# Patient Record
Sex: Male | Born: 1985
Health system: Southern US, Community
[De-identification: ages and names within clinical notes are randomized; demographics above are authoritative.]

## PROBLEM LIST (undated history)

## (undated) DIAGNOSIS — I10 Essential (primary) hypertension: Secondary | ICD-10-CM

## (undated) DIAGNOSIS — K649 Unspecified hemorrhoids: Secondary | ICD-10-CM

---

## 2017-05-15 ENCOUNTER — Emergency Department (HOSPITAL_COMMUNITY): Payer: Self-pay

## 2017-05-15 ENCOUNTER — Emergency Department (HOSPITAL_COMMUNITY)
Admission: EM | Admit: 2017-05-15 | Discharge: 2017-05-15 | Disposition: A | Discharge status: Home / Self Care | Payer: Self-pay | Attending: Emergency Medicine | Admitting: Emergency Medicine

## 2017-05-15 ENCOUNTER — Other Ambulatory Visit: Payer: Self-pay

## 2017-05-15 ENCOUNTER — Encounter (HOSPITAL_COMMUNITY): Payer: Self-pay | Admitting: Emergency Medicine

## 2017-05-15 DIAGNOSIS — R0789 Other chest pain: Secondary | ICD-10-CM | POA: Insufficient documentation

## 2017-05-15 DIAGNOSIS — R0602 Shortness of breath: Secondary | ICD-10-CM | POA: Insufficient documentation

## 2017-05-15 LAB — BASIC METABOLIC PANEL
Anion gap: 7 (ref 5–15)
BUN: 10 mg/dL (ref 6–20)
CALCIUM: 9.5 mg/dL (ref 8.9–10.3)
CHLORIDE: 103 mmol/L (ref 101–111)
CO2: 28 mmol/L (ref 22–32)
CREATININE: 0.98 mg/dL (ref 0.61–1.24)
GFR calc non Af Amer: >60 mL/min (ref >60)
GLUCOSE: 92 mg/dL (ref 65–99)
Potassium: 4.2 mmol/L (ref 3.5–5.1)
Sodium: 138 mmol/L (ref 135–145)

## 2017-05-15 LAB — CBC
HCT: 46.2 % (ref 39.0–52.0)
Hemoglobin: 15.4 g/dL (ref 13.0–17.0)
MCH: 27.7 pg (ref 26.0–34.0)
MCHC: 33.3 g/dL (ref 30.0–36.0)
MCV: 83.1 fL (ref 78.0–100.0)
PLATELETS: 255 10*3/uL (ref 150–400)
RBC: 5.56 MIL/uL (ref 4.22–5.81)
RDW: 14.2 % (ref 11.5–15.5)
WBC: 4.8 10*3/uL (ref 4.0–10.5)

## 2017-05-15 LAB — D-DIMER, QUANTITATIVE: D-Dimer, Quant: <0.27 ug/mL-FEU (ref 0.00–0.50)

## 2017-05-15 LAB — I-STAT TROPONIN, ED: TROPONIN I, POC: 0 ng/mL (ref 0.00–0.08)

## 2017-05-15 MED ORDER — ALBUTEROL SULFATE HFA 108 (90 BASE) MCG/ACT IN AERS
2.0000 | INHALATION_SPRAY | RESPIRATORY_TRACT | Status: DC | PRN
  Administered 2017-05-15: 2 via RESPIRATORY_TRACT
  Filled 2017-05-15: qty 6.7

## 2017-05-15 NOTE — ED Notes (Signed)
Sent label down to add on

## 2017-05-15 NOTE — ED Triage Notes (Signed)
Pt reports L CP radiating to back x 3 weeks, endorses SOB with exertion, denies leg swelling, hx CHF.  Pt also reports intermittent HA, was evaluated and dx with tension HA.  Resp e/u at this time, NAD noted.

## 2017-05-15 NOTE — ED Notes (Signed)
Walked pt to Pod C, pt O2 stayed at 100%.

## 2017-05-15 NOTE — ED Provider Notes (Signed)
[facility] Provider Note   CSN: [id number] Arrival date & time: 05/15/17  1100     History   Chief Complaint Chief Complaint  Patient presents with  . Chest Pain  . Headache    HPI Darrell Monroe is a 31 y.o. male.  Patient is a 31 year old male with no significant past medical history who presents with chest pain and shortness of breath.  He states that about 3 weeks ago he started having shortness of breath, particularly with exertion.  He also notes a sharp pain that starts in his left chest and radiates to his left back.  The pain is  worse with deep breaths.  He has a little bit of a cough in the morning but it is nonproductive.  No URI symptoms.  No fevers.  No leg pain or swelling.  In about a month and a half ago he did have a trip that was about 3 hours long.  He has no history of blood clots but he does have a family history of blood clots in his mom.  He denies tobacco use.  Denies any asthma history.      History reviewed. No pertinent past medical history.  There are no active problems to display for this patient.   History reviewed. No pertinent surgical history.     Home Medications    Prior to Admission medications   Not on File    Family History No family history on file.  Social History Social History   Tobacco Use  . Smoking status: Never Smoker  . Smokeless tobacco: Never Used  Substance Use Topics  . Alcohol use: No    Frequency: Never  . Drug use: No     Allergies   Patient has no allergy information on record.   Review of Systems Review of Systems  Constitutional: Negative for chills, diaphoresis, fatigue and fever.  HENT: Negative for congestion, rhinorrhea and sneezing.   Eyes: Negative.   Respiratory: Positive for cough and shortness of breath. Negative for chest tightness.   Cardiovascular: Positive for chest pain. Negative for leg swelling.  Gastrointestinal: Negative for abdominal  pain, blood in stool, diarrhea, nausea and vomiting.  Genitourinary: Negative for difficulty urinating, flank pain, frequency and hematuria.  Musculoskeletal: Negative for arthralgias and back pain.  Skin: Negative for rash.  Neurological: Negative for dizziness, speech difficulty, weakness, numbness and headaches.     Physical Exam Updated Vital Signs BP (!) 147/69   Pulse 70   Temp 98.4 F (36.9 C) (Oral)   Resp 16   Ht 5' 3.5" (1.613 m)   Wt 111.1 kg (245 lb)   SpO2 100%   BMI 42.72 kg/m   Physical Exam  Constitutional: He is oriented to person, place, and time. He appears well-developed and well-nourished.  HENT:  Head: Normocephalic and atraumatic.  Eyes: Pupils are equal, round, and reactive to light.  Neck: Normal range of motion. Neck supple.  Cardiovascular: Normal rate, regular rhythm and normal heart sounds.  Pulmonary/Chest: Effort normal and breath sounds normal. No respiratory distress. He has no wheezes. He has no rales. He exhibits no tenderness.  Abdominal: Soft. Bowel sounds are normal. There is no tenderness. There is no rebound and no guarding.  Musculoskeletal: Normal range of motion. He exhibits no edema.  No edema or calf tenderness  Lymphadenopathy:    He has no cervical adenopathy.  Neurological: He is alert and oriented to person, place, and time.  Skin:  Skin is warm and dry. No rash noted.  Psychiatric: He has a normal mood and affect.     ED Treatments / Results  Labs (all labs ordered are listed, but only abnormal results are displayed) Labs Reviewed  BASIC METABOLIC PANEL  CBC  D-DIMER, QUANTITATIVE (NOT AT [facility])  I-STAT TROPONIN, ED    EKG  EKG Interpretation  Date/Time:  Monday May 15 2017 11:17:43 EST Ventricular Rate:  72 PR Interval:  126 QRS Duration: 86 QT Interval:  392 QTC Calculation: 429 R Axis:   89 Text Interpretation:  Normal sinus rhythm Cannot rule out Anterior infarct , age undetermined Abnormal ECG No old  tracing to compare Confirmed by Rolan BuccoBelfi, Mackenzey Crownover [id number]) on 05/15/2017 4:52:03 PM       Radiology Dg Chest 2 View  Result Date: 05/15/2017 CLINICAL DATA:  Three weeks of chest pain and shortness of breath. Nonsmoker. EXAM: CHEST  2 VIEW COMPARISON:  None in PACs FINDINGS: The lungs are well-expanded with mild hemidiaphragm flattening. There is no infiltrate, atelectasis, or pleural effusion. There is no pneumothorax or pneumomediastinum. The heart and pulmonary vascularity are normal. The bony thorax exhibits no acute abnormality. IMPRESSION: Borderline hyperinflation may be voluntary or may reflect reactive airway disease. There is no other acute cardiopulmonary abnormality. Electronically Signed   By: David  Swazil[place]Jordan M.D.   On: 05/15/2017 12:14    Procedures Procedures (including critical care time)  Medications Ordered in ED Medications  albuterol (PROVENTIL HFA;VENTOLIN HFA) 108 (90 Base) MCG/ACT inhaler 2 puff (2 puffs Inhalation Given 05/15/17 1742)     Initial Impression / Assessment and Plan / ED Course  I have reviewed the triage vital signs and the nursing notes.  Pertinent labs & imaging results that were available during my care of the patient were reviewed by me and considered in my medical decision making (see chart for details).     Patient presents with some shortness of breath and left-sided pleuritic chest pain.  His chest x-ray is clear although there is some possible evidence of reactive airway disease.  There is no evidence of pneumonia or pneumothorax.  There is no suggestions of acute coronary syndrome.  He is currently denying any pain.  His d-dimer is negative and he has no other suggestions of pulmonary embolus.  No tachycardia or hypoxia.  He was able to ambulate around the ED without any symptoms or hypoxia.  Will give him a trial of an inhaler.  He was encouraged to follow-up with a PCP.  He was given a list of outpatient resources.  Return precautions were  given.  Final Clinical Impressions(s) / ED Diagnoses   Final diagnoses:  Shortness of breath  Atypical chest pain    ED Discharge Orders    None       Rolan BuccoBelfi, Adlene Adduci, MD 05/15/17 [phone]

## 2018-08-28 MED FILL — CITALOPRAM HBR 10 MG TABLET: 10 | 30 days supply | Qty: 30 | Fill #0

## 2018-11-05 MED FILL — CITALOPRAM HBR 10 MG TABLET: 10 | 30 days supply | Qty: 30 | Fill #1

## 2018-11-07 IMAGING — DX DG CHEST 2V
2 series · 2 of 2 positions shown · non-contrast
Comparison: None in PACs

CLINICAL DATA: Three weeks of chest pain and shortness of breath.
Nonsmoker.

EXAM:
CHEST  2 VIEW

[chest pa]
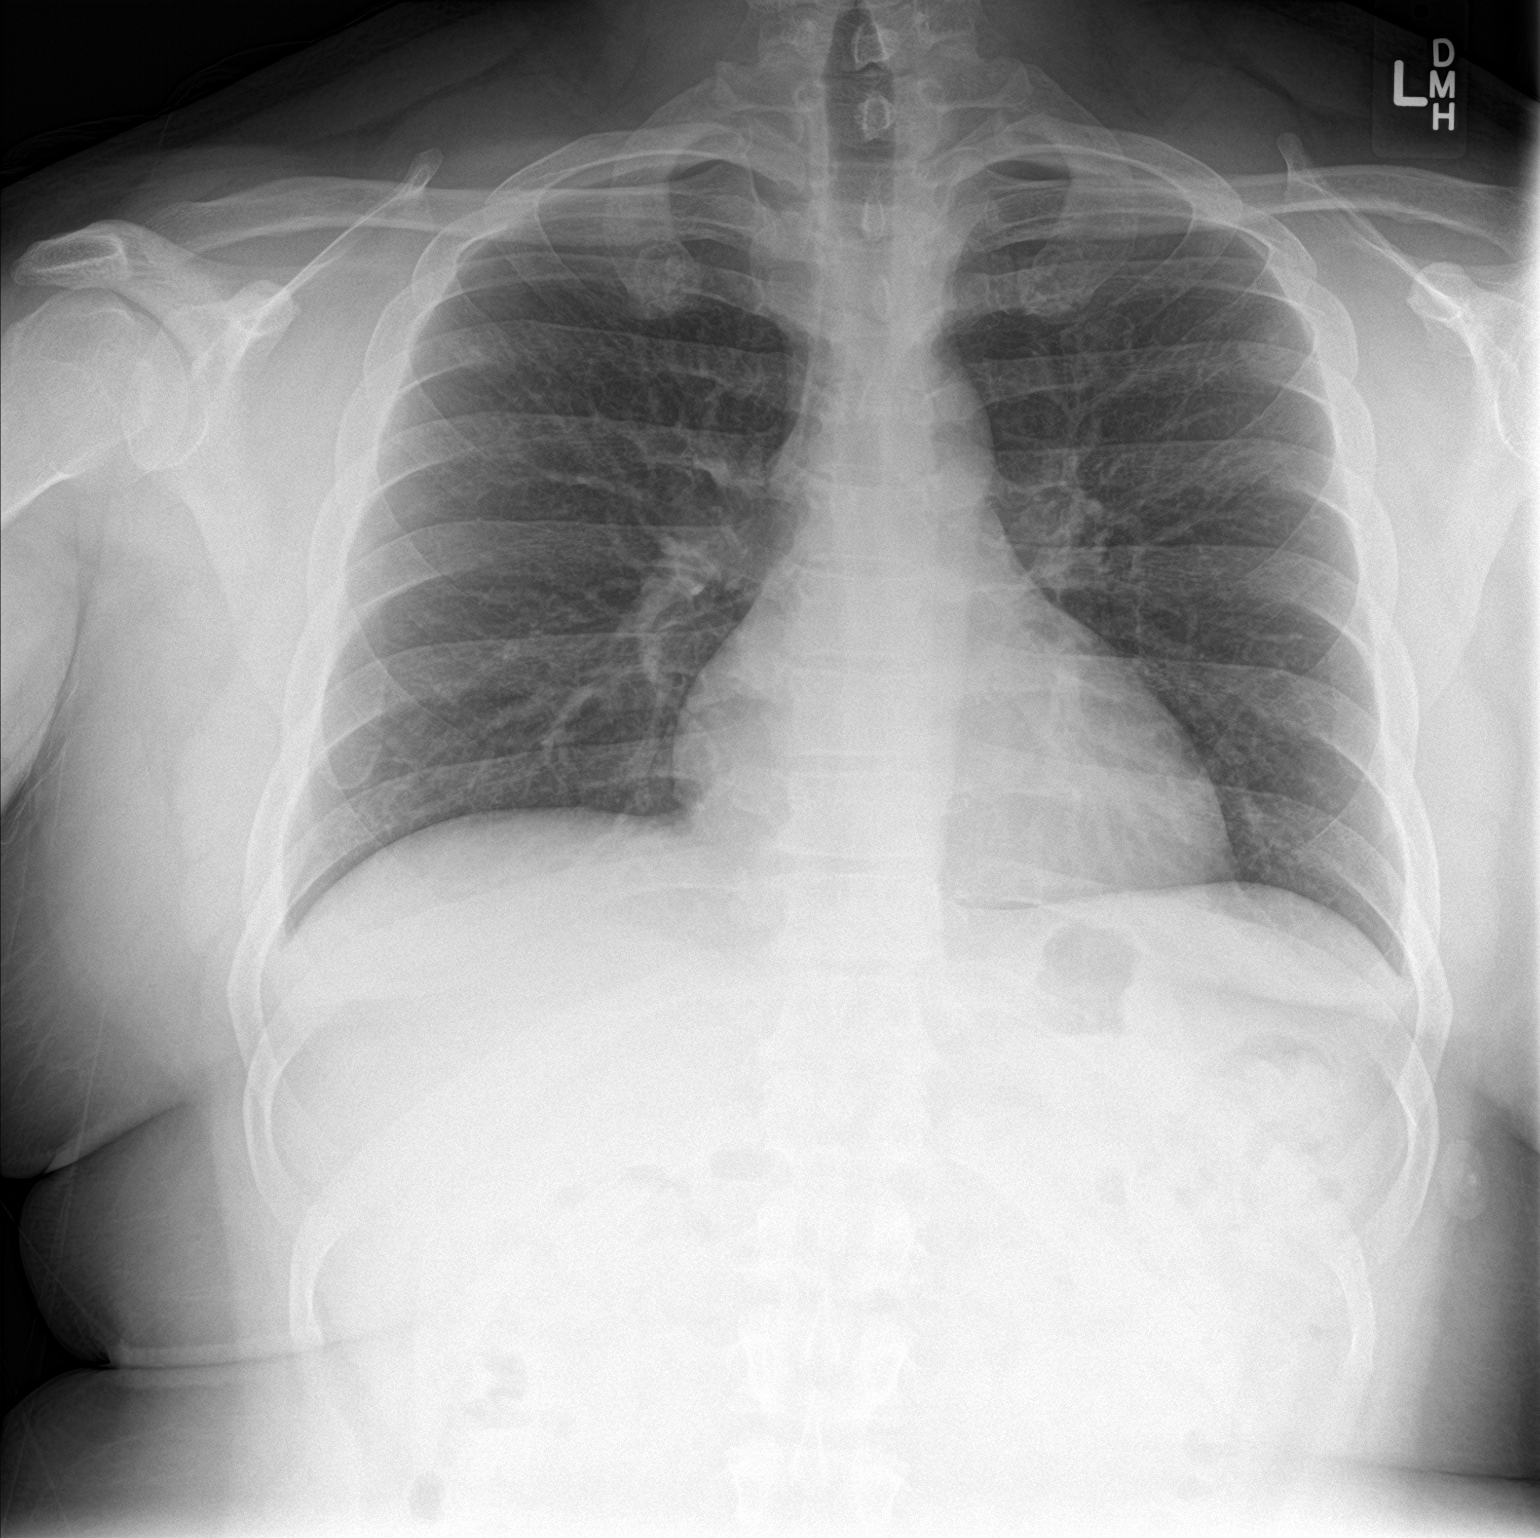

[chest lat]
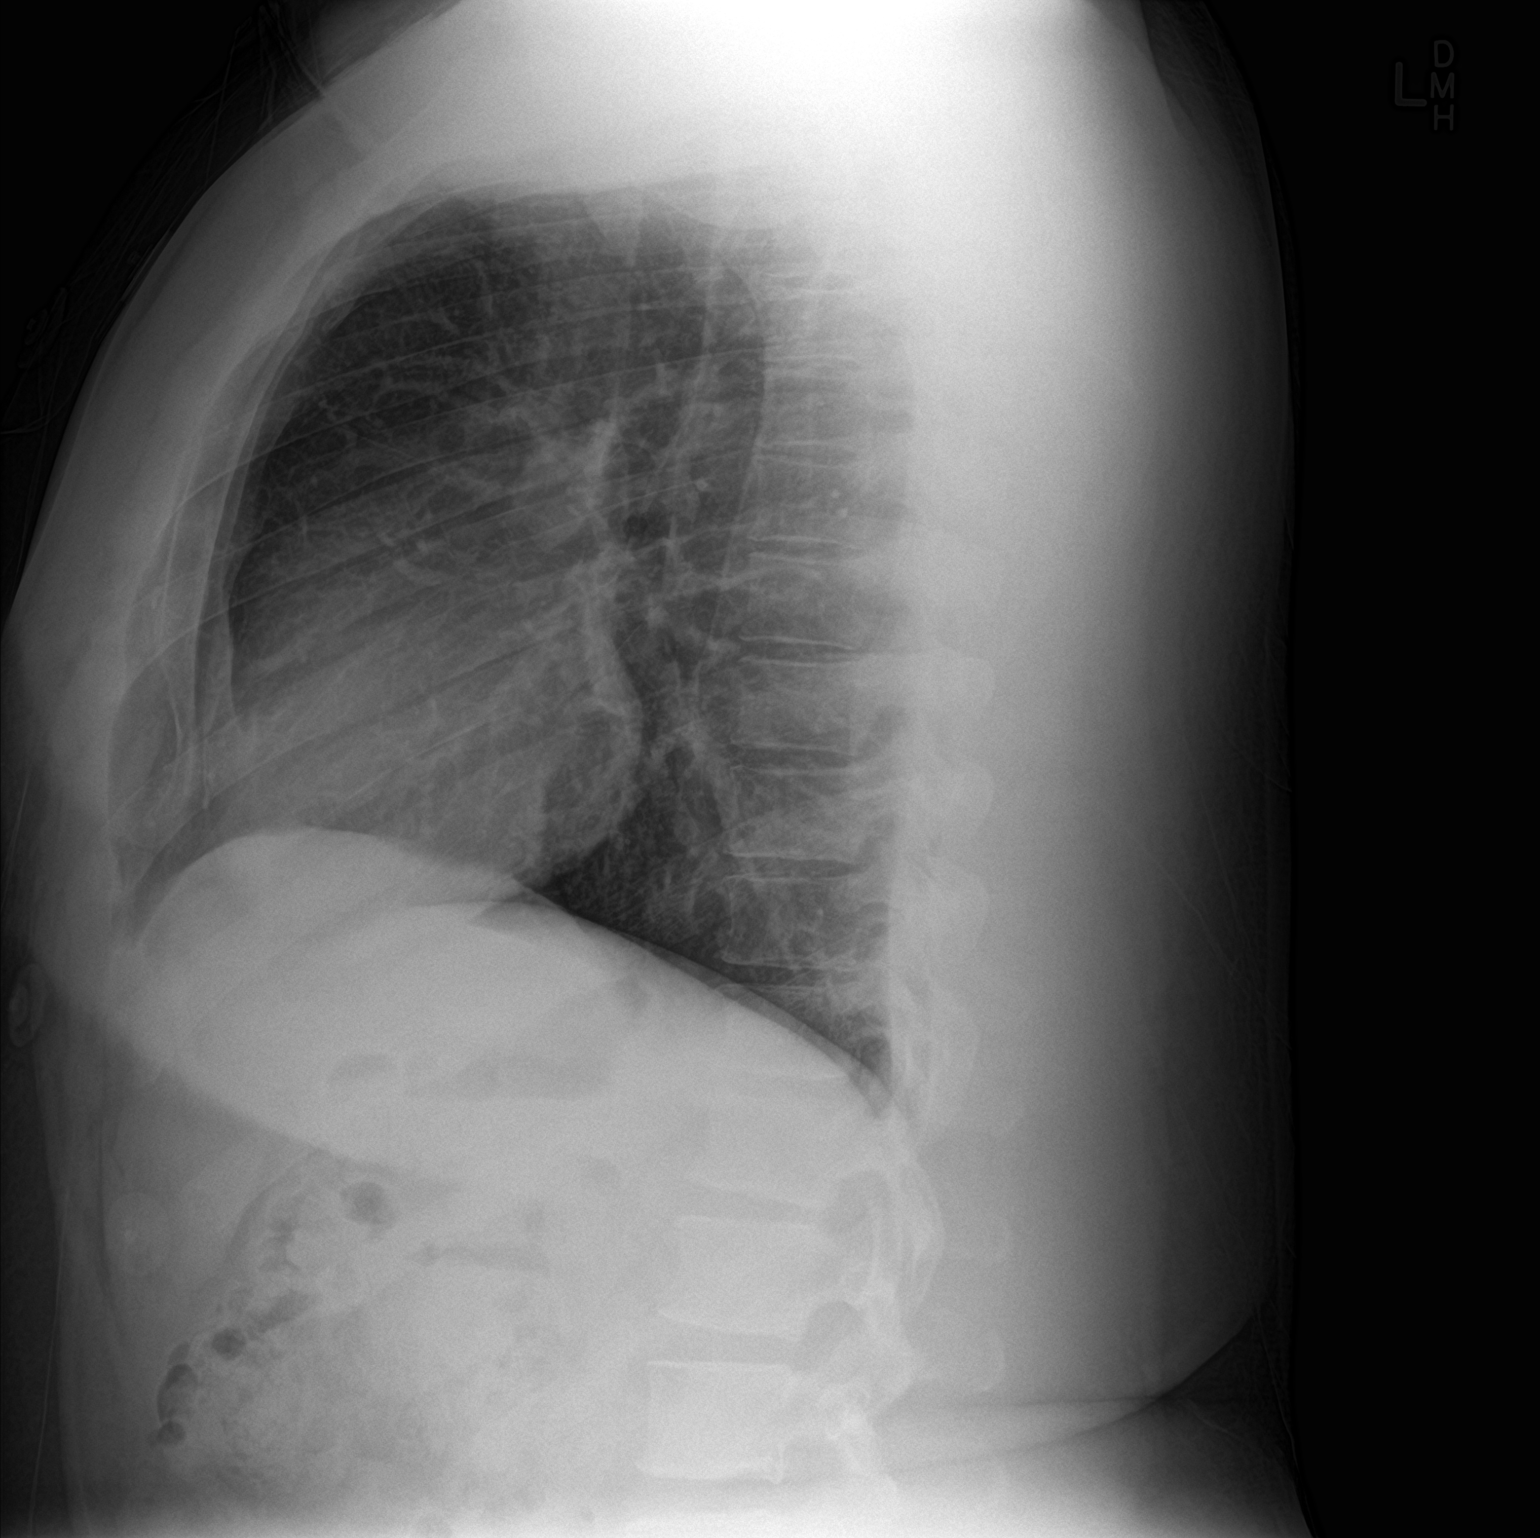

[2 of 2 positions shown; findings below may reference images not displayed]

FINDINGS: The lungs are well-expanded with mild hemidiaphragm flattening.
There is no infiltrate, atelectasis, or pleural effusion. There is
no pneumothorax or pneumomediastinum. The heart and pulmonary
vascularity are normal. The bony thorax exhibits no acute
abnormality.
IMPRESSION: Borderline hyperinflation may be voluntary or may reflect reactive
airway disease. There is no other acute cardiopulmonary abnormality.

## 2019-09-02 ENCOUNTER — Other Ambulatory Visit (HOSPITAL_COMMUNITY): Payer: Self-pay | Admitting: Internal Medicine

## 2019-11-08 ENCOUNTER — Other Ambulatory Visit (HOSPITAL_COMMUNITY): Payer: Self-pay | Admitting: Internal Medicine

## 2019-12-09 ENCOUNTER — Other Ambulatory Visit (HOSPITAL_COMMUNITY): Payer: Self-pay | Admitting: Internal Medicine

## 2020-06-18 ENCOUNTER — Other Ambulatory Visit (HOSPITAL_COMMUNITY): Payer: Self-pay | Admitting: Gastroenterology

## 2020-06-26 ENCOUNTER — Other Ambulatory Visit: Payer: Self-pay | Admitting: Gastroenterology

## 2020-08-11 NOTE — Progress Notes (Signed)
Attempted to obtain medical history via telephone, unable to reach at this time. I left a voicemail to return pre surgical testing department's phone call.  

## 2020-08-13 ENCOUNTER — Other Ambulatory Visit: Payer: Self-pay | Admitting: Gastroenterology

## 2020-08-14 ENCOUNTER — Other Ambulatory Visit (HOSPITAL_COMMUNITY): Payer: BC Managed Care – PPO

## 2020-08-17 ENCOUNTER — Encounter (HOSPITAL_COMMUNITY): Payer: Self-pay | Admitting: Certified Registered Nurse Anesthetist

## 2020-08-17 NOTE — Anesthesia Preprocedure Evaluation (Deleted)
Anesthesia Evaluation    Reviewed: Allergy & Precautions, Patient's Chart, lab work & pertinent test results  History of Anesthesia Complications Negative for: history of anesthetic complications  Airway        Dental   Pulmonary neg pulmonary ROS,           Cardiovascular negative cardio ROS       Neuro/Psych negative neurological ROS  negative psych ROS   GI/Hepatic Neg liver ROS, GERD  ,LLQ abd pain, rectal bleeding   Endo/Other  negative endocrine ROS  Renal/GU negative Renal ROS  negative genitourinary   Musculoskeletal negative musculoskeletal ROS (+)   Abdominal   Peds  Hematology negative hematology ROS (+)   Anesthesia Other Findings Day of surgery medications reviewed with patient.  Reproductive/Obstetrics negative OB ROS                             Anesthesia Physical Anesthesia Plan  ASA:   Anesthesia Plan: MAC   Post-op Pain Management:    Induction:   PONV Risk Score and Plan: Treatment may vary due to age or medical condition and Propofol infusion  Airway Management Planned: Natural Airway and Nasal Cannula  Additional Equipment:   Intra-op Plan:   Post-operative Plan:   Informed Consent:   Plan Discussed with:   Anesthesia Plan Comments:         Anesthesia Quick Evaluation

## 2020-08-18 ENCOUNTER — Encounter (HOSPITAL_COMMUNITY): Payer: Self-pay

## 2020-08-18 ENCOUNTER — Ambulatory Visit (HOSPITAL_COMMUNITY): Admit: 2020-08-18 | Payer: BC Managed Care – PPO | Admitting: Gastroenterology

## 2020-08-18 SURGERY — COLONOSCOPY WITH PROPOFOL
Anesthesia: Monitor Anesthesia Care

## 2020-10-08 ENCOUNTER — Other Ambulatory Visit (HOSPITAL_COMMUNITY): Payer: Self-pay

## 2020-10-08 MED FILL — Linaclotide Cap 290 MCG: ORAL | 30 days supply | Qty: 30 | Fill #0 | Status: CN

## 2020-10-08 MED FILL — Pantoprazole Sodium EC Tab 40 MG (Base Equiv): ORAL | 30 days supply | Qty: 30 | Fill #0 | Status: CN

## 2020-10-08 MED FILL — Citalopram Hydrobromide Tab 20 MG (Base Equiv): ORAL | 30 days supply | Qty: 30 | Fill #0 | Status: AC

## 2020-10-08 MED FILL — Ergocalciferol Cap 1.25 MG (50000 Unit): ORAL | 28 days supply | Qty: 4 | Fill #0 | Status: AC

## 2020-11-09 ENCOUNTER — Other Ambulatory Visit (HOSPITAL_COMMUNITY): Payer: Self-pay

## 2020-11-09 MED ORDER — CITALOPRAM HYDROBROMIDE 20 MG PO TABS
ORAL_TABLET | Freq: Every evening | ORAL | 2 refills | Status: DC
Start: 2020-11-09 — End: 2020-11-16
  Filled 2020-11-09: qty 30, 30d supply, fill #0

## 2020-11-09 MED FILL — Ergocalciferol Cap 1.25 MG (50000 Unit): ORAL | 28 days supply | Qty: 4 | Fill #1 | Status: AC

## 2020-11-10 ENCOUNTER — Other Ambulatory Visit (HOSPITAL_COMMUNITY): Payer: Self-pay

## 2020-11-16 ENCOUNTER — Other Ambulatory Visit: Payer: Self-pay

## 2020-11-16 ENCOUNTER — Encounter: Payer: Self-pay | Admitting: Family Medicine

## 2020-11-16 ENCOUNTER — Ambulatory Visit (INDEPENDENT_AMBULATORY_CARE_PROVIDER_SITE_OTHER): Payer: BC Managed Care – PPO | Admitting: Family Medicine

## 2020-11-16 ENCOUNTER — Other Ambulatory Visit (HOSPITAL_COMMUNITY): Payer: Self-pay

## 2020-11-16 ENCOUNTER — Other Ambulatory Visit: Payer: Self-pay | Admitting: Family Medicine

## 2020-11-16 VITALS — BP 124/86 | HR 67 | Ht 66.5 in | Wt 316.0 lb

## 2020-11-16 DIAGNOSIS — Z113 Encounter for screening for infections with a predominantly sexual mode of transmission: Secondary | ICD-10-CM | POA: Diagnosis not present

## 2020-11-16 DIAGNOSIS — Z7689 Persons encountering health services in other specified circumstances: Secondary | ICD-10-CM | POA: Diagnosis not present

## 2020-11-16 DIAGNOSIS — K625 Hemorrhage of anus and rectum: Secondary | ICD-10-CM

## 2020-11-16 DIAGNOSIS — F411 Generalized anxiety disorder: Secondary | ICD-10-CM

## 2020-11-16 DIAGNOSIS — F324 Major depressive disorder, single episode, in partial remission: Secondary | ICD-10-CM

## 2020-11-16 HISTORY — DX: Hemorrhage of anus and rectum: K62.5

## 2020-11-16 HISTORY — DX: Persons encountering health services in other specified circumstances: Z76.89

## 2020-11-16 LAB — POCT HEMOGLOBIN: Hemoglobin: 14 g/dL (ref 11–14.6)

## 2020-11-16 MED ORDER — CITALOPRAM HYDROBROMIDE 40 MG PO TABS
40.0000 mg | ORAL_TABLET | Freq: Every day | ORAL | 3 refills | Status: DC
Start: 2020-11-16 — End: 2020-12-10
  Filled 2020-11-16: qty 30, 30d supply, fill #0

## 2020-11-16 NOTE — Progress Notes (Signed)
SUBJECTIVE:   CHIEF COMPLAINT / HPI:   New Patient   Current concerns Always tired, 2-3 red bulls per day. Sleeps 11-7 nightly. Problems with depression but has not been treated. Feels down all the time. Tried to walk in front of a bus in the past. Has had periods of feeling full of energy where he cant sleep.   Blood in stool Every other week. Some times black and tarry sometimes bright red. Last happened on week ago.  Reports that he has spoken to his primary care provider before in the past about this but that he said it may be has some internal hemorrhoids.  Has not seen a GI doctor about this.  Of note, per chart review patient had a colonoscopy scheduled for 08/18/2020 which was canceled.  PMH  Depression  SI attempt 2019 stepped in front of a bus  Acid reflux- omeprazole daily  PSH  None   Allergies  Bees  Social  Live in [place] with brothers. 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 Does not drin, smoke, illicit substances, marijauna. Has depression. No SI or HI a this. Sexually active with women, uses protection. No hx of STIs  Family history Maternal asthma, bleeding disorder, depression, diabetes, hypertension Paternal alcohol and drug abuse A sister with asthma, depression, CVA Brother with asthma and depression Grandparents with asthma, bleeding/clotting disorder, heart attack, diabetes, hyperlipidemia, hypertension, kidney disease, osteoporosis, strokes  OBJECTIVE:   BP 124/86   Pulse 67   Ht 5' 6.5" (1.689 m)   Wt (!) 316 lb (143.3 kg)   SpO2 98%   BMI 50.24 kg/m   General: Obese, well-appearing 35 year old male in no acute distress Cardiac: Regular rate and rhythm, no murmurs appreciated Respiratory: Normal work of breathing, lungs clear to auscultation bilaterally Abdomen: Diffuse abdominal tenderness, positive bowel sounds, soft abdomen Rectal exam: No gross abnormalities, no appreciable external hemorrhoids MSK: No gross abnormalities Psych: Normal mood  although he does report that he gets down regularly, denies any SI or HI.  Reports suicide attempt in 2019 where he tried to step in front of a bus.  Is currently compliant with Celexa.  ASSESSMENT/PLAN:   Encounter to establish care with new doctor Patient here to establish care.  See concerns below.  Bright red blood per rectum Patient reports longstanding history of bright red blood per rectum which is occasionally dark and tarry for years.  He reports it happens approximately every other week.  Reports sometimes he sees large amounts of blood.  Denies any hemorrhoids that he knows of.  Per chart review he has been scheduled to see gastroenterology for colonoscopy but that procedure was canceled.  I am placing referral for him to see gastroenterology today.  I also had a POC hemoglobin performed today which came back within normal limits but we will check a CBC to verify.  Strict ED precautions given.  Depression, major, single episode, in partial remission [facility]) Patient with longtime history of depression.  He reports he is on Celexa 20 mg daily which she feels is helping some but he still has considerable sadness.  Does report intermittent issues with elevated activity concerning for possible mania given he has been on this medication for considerable amount of time I do not feel that this is likely.  Denies any SI or HI at this time although reports he has had a suicide attempt in 2019.  Provided patient with information regarding psychiatric resources and put a referral in for care coordination.  Strict ED precautions given.  Routine screening for STI (sexually transmitted infection) Patient is sexually active.  Reports he uses protection.  He has generalized fatigue so we will check for HIV.  We will also check for RPR, hepatitis C,     Darrell Nip, MD [facility]

## 2020-11-16 NOTE — Assessment & Plan Note (Signed)
Patient is sexually active.  Reports he uses protection.  He has generalized fatigue so we will check for HIV.  We will also check for RPR, hepatitis C,

## 2020-11-16 NOTE — Assessment & Plan Note (Signed)
Patient here to establish care.  See concerns below.

## 2020-11-16 NOTE — Assessment & Plan Note (Signed)
Patient with longtime history of depression.  He reports he is on Celexa 20 mg daily which she feels is helping some but he still has considerable sadness.  Does report intermittent issues with elevated activity concerning for possible mania given he has been on this medication for considerable amount of time I do not feel that this is likely.  Denies any SI or HI at this time although reports he has had a suicide attempt in 2019.  Provided patient with information regarding psychiatric resources and put a referral in for care coordination.  Strict ED precautions given.

## 2020-11-16 NOTE — Patient Instructions (Signed)
It was wonderful meeting you today.  Regarding your fatigue I am concerned that this is either related to anemia or your depression.  We are going to collect some lab work to look for reasons for this fatigue.  Regarding your bleeding from your rectum I have put a referral for gastroenterology.  They will be contacting you to schedule an appointment.  Regarding your depression I have increased your Celexa and I would like to see you in 2 weeks.  I also want you to establish with a psychiatrist, we have placed a referral for someone to help you with this but below is some information on psychiatrist in the area.  If you have any worsening symptoms, thoughts of harming yourself please seek medical attention immediately.  I hope you have a wonderful afternoon and I will see you in 2 weeks.  Psychiatry Resource List (Adults and Children) Most of these providers will take Medicaid. please consult your insurance for a complete and updated list of available providers. When calling to make an appointment have your insurance information available to confirm you are covered.   [facility]:Psychiatry and Counseling [address] [place], [place] [address] [phone]  [facility]  [address] [place], [place] [phone] Crisis [phone]   [facility]:   [facility]: [address]     [phone]   [facility]: [address] [place]. [place],        [phone] [place]: [address],    [phone] Kathryne Sharper: [address],                   [phone] Children: [facility] [address]         [phone]   [facility]  (Psychiatry only; Adults /children 12 and over, will take Medicaid)  [address] Laurell Josephs [address], [place], [place] [address]       [phone]   [facility] (Psychiatry & counseling ; adults & children ; will take  Medicaid [address]  [place] [address]   Go on-line to complete referral ( https://www.[facility].org/en/make-a-referral [phone]   (Spanish therapist)  [facility]  Psychiatry & counseling; Adults and children;  Call Registration prior to scheduling an appointment [phone] [address]    [place], [place] [address]    [phone]  [facility] (Psychiatry & counseling; adults & children; Medicare no Medicaid)  [address]   [place], [place]  [address]      [phone]    [facility] (up to age 57)  Psychiatry & counseling ,will take Medicaid, must do counseling to receive psychiatry services  [address] [place] [address]        [phone]  [facility] (Psychiatry & counseling; adults & children; will take Medicaid) Will need a referral from provider [address],  [place], [place]  [phone]   [facility] --- Walk-In Mon-Friday 8am-3pm ( will take Medicaid, Psychiatry, Adults & children,  [address] [place], [place]   [phone]   [facility] [place]--, Walk-in M-F 8am-12pm and 1pm -3pm   (Counseling, Psychiatry, will take Medicaid, adults & children)  [address], [place], [place]  [phone]     If you are feeling suicidal or depression symptoms worsen please immediately go  to:   24 Hour Availability [facility]  [address] [place], [place] [phone] Crisis [phone]    If you are thinking about harming yourself or having thoughts of suicide, or if you know someone who is, seek help right away. Call your doctor or mental health care provider. Call 911 or go to a hospital emergency room to get immediate help, or ask a friend or family member to help you do these things. Call the [place] National Suicide Prevention Lifeline's toll-free, 24-hour hotline at  1-800-273-TALK (346) 121-6098) or TTY: 1-800-799-4 TTY (720-258-2690) to talk to a trained counselor. If you are in crisis, make sure you are not left alone.  If someone else is in crisis, make sure he or she is not left alone   [facility] [employer] (Domestic Violence, Rape & Victim Assistance [phone]  [facility]    (ONLY from 8am-4pm)    [phone]  [facility] Mobile Crisis Unit (24/7)   [phone]  [place] National Suicide Hotline   [phone] Len Childs)

## 2020-11-16 NOTE — Assessment & Plan Note (Signed)
Patient reports longstanding history of bright red blood per rectum which is occasionally dark and tarry for years.  He reports it happens approximately every other week.  Reports sometimes he sees large amounts of blood.  Denies any hemorrhoids that he knows of.  Per chart review he has been scheduled to see gastroenterology for colonoscopy but that procedure was canceled.  I am placing referral for him to see gastroenterology today.  I also had a POC hemoglobin performed today which came back within normal limits but we will check a CBC to verify.  Strict ED precautions given.

## 2020-11-17 LAB — COMPREHENSIVE METABOLIC PANEL
ALT: 43 IU/L (ref 0–44)
AST: 25 IU/L (ref 0–40)
Albumin/Globulin Ratio: 1.5 (ref 1.2–2.2)
Albumin: 4.2 g/dL (ref 4.0–5.0)
Alkaline Phosphatase: 51 IU/L (ref 44–121)
BUN/Creatinine Ratio: 13 (ref 9–20)
BUN: 15 mg/dL (ref 6–20)
Bilirubin Total: <0.2 mg/dL (ref 0.0–1.2)
CO2: 27 mmol/L (ref 20–29)
Calcium: 9.7 mg/dL (ref 8.7–10.2)
Chloride: 100 mmol/L (ref 96–106)
Creatinine, Ser: 1.12 mg/dL (ref 0.76–1.27)
Globulin, Total: 2.8 g/dL (ref 1.5–4.5)
Glucose: 89 mg/dL (ref 65–99)
Potassium: 4.3 mmol/L (ref 3.5–5.2)
Sodium: 140 mmol/L (ref 134–144)
Total Protein: 7 g/dL (ref 6.0–8.5)
eGFR: 89 mL/min/{1.73_m2} (ref >59)

## 2020-11-17 LAB — RPR: RPR Ser Ql: NONREACTIVE

## 2020-11-17 LAB — CBC WITH DIFFERENTIAL/PLATELET
Basophils Absolute: 0 10*3/uL (ref 0.0–0.2)
Basos: 1 %
EOS (ABSOLUTE): 0.1 10*3/uL (ref 0.0–0.4)
Eos: 3 %
Hematocrit: 44.1 % (ref 37.5–51.0)
Hemoglobin: 14.4 g/dL (ref 13.0–17.7)
Immature Grans (Abs): 0 10*3/uL (ref 0.0–0.1)
Immature Granulocytes: 0 %
Lymphocytes Absolute: 2.3 10*3/uL (ref 0.7–3.1)
Lymphs: 43 %
MCH: 26.4 pg — ABNORMAL LOW (ref 26.6–33.0)
MCHC: 32.7 g/dL (ref 31.5–35.7)
MCV: 81 fL (ref 79–97)
Monocytes Absolute: 0.5 10*3/uL (ref 0.1–0.9)
Monocytes: 9 %
Neutrophils Absolute: 2.3 10*3/uL (ref 1.4–7.0)
Neutrophils: 44 %
Platelets: 313 10*3/uL (ref 150–450)
RBC: 5.46 x10E6/uL (ref 4.14–5.80)
RDW: 13.8 % (ref 11.6–15.4)
WBC: 5.2 10*3/uL (ref 3.4–10.8)

## 2020-11-17 LAB — HCV AB W REFLEX TO QUANT PCR: HCV Ab: 0.2 s/co ratio (ref 0.0–0.9)

## 2020-11-17 LAB — HIV ANTIBODY (ROUTINE TESTING W REFLEX): HIV Screen 4th Generation wRfx: NONREACTIVE

## 2020-11-17 LAB — HCV INTERPRETATION

## 2020-11-17 LAB — SEDIMENTATION RATE: Sed Rate: 12 mm/hr (ref 0–15)

## 2020-11-18 ENCOUNTER — Telehealth: Payer: Self-pay | Admitting: *Deleted

## 2020-11-18 NOTE — Chronic Care Management (AMB) (Signed)
  Care Management   Outreach Note  11/18/2020 Name: Darrell Monroe MRN: [id number] DOB: 05/17/1986  Referred by: Derrel Nip, MD Reason for referral : Care Coordination (Initial outreach to schedule referral with Licensed Clinical SW )   An unsuccessful telephone outreach was attempted today. The patient was referred to the case management team for assistance with care management and care coordination.   Follow Up Plan: A HIPAA compliant phone message was left for the patient providing contact information and requesting a return call.  If patient returns call to provider office, please advise to call Embedded Care Management Care Guide Darrell Monroe at [phone]  Burman Nieves, CCMA Care Guide, Embedded Care Coordination [facility]  Care Management  Direct Dial: [phone]

## 2020-11-26 NOTE — Chronic Care Management (AMB) (Signed)
  Care Management   Note  11/26/2020 Name: Darrell Monroe MRN: [id number] DOB: 1985/07/09  Darrell Monroe is a 35 y.o. year old male who is a primary care patient of Derrel Nip, MD. I reached out to Darrell Monroe by phone today in response to a referral sent by Mr. Jeromiah Ohalloran Koone's health plan.    Mr. Otten was given information about care management services today including:  Care management services include personalized support from designated clinical staff supervised by his physician, including individualized plan of care and coordination with other care providers 24/7 contact phone numbers for assistance for urgent and routine care needs. The patient may stop care management services at any time by phone call to the office staff.  Patient agreed to services and verbal consent obtained.   Follow up plan: Telephone appointment with care management team member scheduled for:12/01/2020  Burman Nieves, CCMA Care Guide, Embedded Care Coordination [facility]  Care Management  Direct Dial: [phone]

## 2020-11-27 ENCOUNTER — Other Ambulatory Visit (HOSPITAL_COMMUNITY): Payer: Self-pay

## 2020-12-01 ENCOUNTER — Ambulatory Visit: Payer: BC Managed Care – PPO | Admitting: Licensed Clinical Social Worker

## 2020-12-01 DIAGNOSIS — Z7689 Persons encountering health services in other specified circumstances: Secondary | ICD-10-CM

## 2020-12-01 NOTE — Chronic Care Management (AMB) (Signed)
    Clinical Social Work  Care Management   Phone Outreach    12/01/2020 Name: Darrell Monroe MRN: [id number] DOB: 12-Jul-1985  Darrell Monroe is a 35 y.o. year old male who is a primary care patient of Derrel Nip, MD .   1st call: CCM LCSW reached out to patient today by phone to introduce self, assess needs and offer Care Management services and interventions.    Telephone outreach was unsuccessful A HIPPA compliant phone message was left for the patient providing contact information and requesting a return call.   2nd call: CCM LCSW reached out to patient today by phone to introduce self, assess needs and offer Care Management services and interventions.    Unable to keep phone appointment today and requested to reschedule.  Plan:Appointment was rescheduled with CCM LCSW for Thursday December 10, 2020  Review of patient status, including review of consultants reports, relevant laboratory and other test results, and collaboration with appropriate care team members and the patient's provider was performed as part of comprehensive patient evaluation and provision of care management services.     Sammuel Hines, LCSW Care Management & Coordination  [facility]   [phone] 3:41 PM

## 2020-12-01 NOTE — Patient Instructions (Signed)
  Per your request your phone appointment with with me Has been rescheduled for Thursday July 14th    Thanks  Sammuel Hines, [facility] Care Management & Coordination  [phone]

## 2020-12-02 ENCOUNTER — Ambulatory Visit: Payer: BC Managed Care – PPO | Admitting: Family Medicine

## 2020-12-10 ENCOUNTER — Ambulatory Visit (INDEPENDENT_AMBULATORY_CARE_PROVIDER_SITE_OTHER): Payer: BC Managed Care – PPO | Admitting: Family Medicine

## 2020-12-10 ENCOUNTER — Ambulatory Visit: Payer: BC Managed Care – PPO | Admitting: Licensed Clinical Social Worker

## 2020-12-10 ENCOUNTER — Other Ambulatory Visit: Payer: Self-pay

## 2020-12-10 ENCOUNTER — Encounter: Payer: Self-pay | Admitting: Family Medicine

## 2020-12-10 VITALS — BP 122/68 | HR 72 | Ht 67.0 in | Wt 313.4 lb

## 2020-12-10 DIAGNOSIS — M5416 Radiculopathy, lumbar region: Secondary | ICD-10-CM

## 2020-12-10 DIAGNOSIS — M5442 Lumbago with sciatica, left side: Secondary | ICD-10-CM

## 2020-12-10 DIAGNOSIS — Z7189 Other specified counseling: Secondary | ICD-10-CM

## 2020-12-10 DIAGNOSIS — G8929 Other chronic pain: Secondary | ICD-10-CM | POA: Diagnosis not present

## 2020-12-10 MED ORDER — DULOXETINE HCL 30 MG PO CPEP: 30.0000 mg | ORAL_CAPSULE | Freq: Every day | ORAL | 3 refills | Status: DC

## 2020-12-10 NOTE — Progress Notes (Signed)
    SUBJECTIVE:   CHIEF COMPLAINT / HPI:   Back Pain Back pain for 1 year, takes naproxen for pain.  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  He reports that they are required to use proper mechanics when lifting but he is still having issues.  No loss of bowel or bladder function.  Depression and GI follow-up Patient reports that his depression is doing okay and he is supposed to meet with a psychiatrist.  He also has an appointment scheduled with a gastroenterologist scheduled for next week.  OBJECTIVE:   BP 122/68   Pulse 72   Ht 5\' 7"  (1.702 m)   Wt (!) 313 lb 6.4 oz (142.2 kg)   SpO2 97%   BMI 49.09 kg/m   General: Well-appearing 35 year old male, no acute distress Cardiac: Regular rate and rhythm, no murmurs appreciated Respiratory: Normal work of breathing, lungs clear to auscultation bilaterally Abdomen: Soft, nontender, positive bowel sounds MSK: Patient with full range of motion of the lumbar spine.  Pain noticed with extension as well as rotation.  Pain also noticed when leans to the right to try and pick something up.  Mild tenderness and paraspinal muscles to the right.  Reports that the pain shoots down his right lower extremity.  ASSESSMENT/PLAN:   Lumbar radiculopathy Patient with chronic low back pain complaining of pain shooting from his back down his right lower extremity.  Physical exam consistent with lumbar radiculopathy.  Pain has been going on for 1 year with worsening radiculopathy pain over the last month.  We will transition patient's antidepressant to Cymbalta to hopefully help with some neuropathic pain.  I have also placed referral for physical therapy.  We will follow-up in 2 months.  Discussed red flag symptoms and strict return precautions/ED precautions.  Patient is agreeable to this.  If pain does not improve may require imaging and referral to  neurosurgery.     20, MD [facility]

## 2020-12-10 NOTE — Chronic Care Management (AMB) (Signed)
Care Management Clinical Social Work Note  12/10/2020 Name: Darrell Monroe MRN: [id number] DOB: [birth year]  Darrell Monroe is a 36 y.o. year old male who is a primary care patient of Derrel Nip, MD.  The Care Management team was consulted for assistance with chronic disease management and coordination needs.  Engaged with patient by telephone for initial visit in response to provider referral for social work chronic care management and care coordination services  Consent to Services:  Darrell Monroe was given information about Care Management services today including:  Care Management services includes personalized support from designated clinical staff supervised by his physician, including individualized plan of care and coordination with other care providers 24/7 contact phone numbers for assistance for urgent and routine care needs. The patient may stop case management services at any time by phone call to the office staff.  Patient agreed to services and consent obtained.   Assessment: Patient is currently experiencing symptoms of  anxiety and depression which seems to be exacerbated by stress from work, reports he has never had counseling and would like to connect to mental health provider for medication evaluation and therapy. Offered to assist patient with connecting to providers he would like to make the calls himself.. See Care Plan below for interventions and patient self-care actives.  Recent life changes Efrain Sella: Stress at work  Recommendation: Patient may benefit from, and is in agreement to contact providers discussed during encounter today .  Follow up Plan: Patient would like continued follow-up from CCM LCSW .  Follow up scheduled in 2 weeks. Patient will call office if needed prior to next encounter.    Review of patient past medical history, allergies, medications, and health status, including review of relevant consultants reports was performed today as part of a  comprehensive evaluation and provision of chronic care management and care coordination services.  SDOH (Social Determinants of Health) assessments and interventions performed:    Advanced Directives Status: Not addressed in this encounter.  Care Plan  No Known Allergies  Outpatient Encounter Medications as of 12/10/2020  Medication Sig   citalopram (CELEXA) 40 MG tablet Take 1 tablet (40 mg total) by mouth daily.   linaclotide (LINZESS) 290 MCG CAPS capsule TAKE 1 CAPSULE BY MOUTH ONCE A DAY WITH FIRST MEAL 30 DAYS (Patient not taking: Reported on 11/16/2020)   pantoprazole (PROTONIX) 40 MG tablet TAKE 1 TABLET BY MOUTH ONCE A DAY 30 DAY(S) (Patient not taking: Reported on 11/16/2020)   Vitamin D, Ergocalciferol, (DRISDOL) 1.25 MG (50000 UNIT) CAPS capsule Take 50,000 Units by mouth every Thursday.   No facility-administered encounter medications on file as of 12/10/2020.    Patient Active Problem List   Diagnosis Date Noted   Encounter to establish care with new doctor 11/16/2020   Generalized anxiety disorder 11/16/2020   Bright red blood per rectum 11/16/2020   Depression, major, single episode, in partial remission (HCC) 11/16/2020   Routine screening for STI (sexually transmitted infection) 11/16/2020    Conditions to be addressed/monitored: Anxiety and Depression; Mental Health Concerns   Care Plan : General Social Work (Adult)  Updates made by Soundra Pilon, LCSW since 12/10/2020 12:00 AM   Problem: Emotional Distress    Goal: Emotional Health Supported by connecting with Mental Health Provider   Start Date: 12/10/2020  This Visit's Progress: On track  Priority: High  Current Barriers:  Care Coordination needs related to Mental Health Concerns  in a patient with Anxiety and Depression  Social Work  CM Clinical Goal(s):  Patient will work with Licensed Visual merchandiser to connect with Mental health provider to address needs related to Anxiety and Depression   Interventions: 1:1 collaboration with @PCP @ regarding development and update of comprehensive plan of care as evidenced by provider attestation and co-signature Inter-disciplinary care team collaboration (see longitudinal plan of care) Assessed patient's previous and current treatment, coping skills, support system and barriers to care  Review various resources, discussed options and provided patient information about  mental health providers based on patient's insurance [facility]) Mindfulness or [facility], Reviewed mental health medications with patient and discussed compliance: Patient is currently taking 40mg  of Celexa;, Participation in counseling encouraged , Provided EMMI education information on breathing to relax, and Discussed referral to [facility] to assist with connecting to mental health provider ; Patient would like to call providers on list and does not want [facility] referral at this time. Patient Goals/Self-Care Activities: Call [facility] [phone] to schedule your medication management appointment Call to schedule your counseling appointment from the list provided Continue with compliance of taking medication  Review your EMMI educational information on breathing to relax, Look for an e-mail from [facility]  Follow Up Plan: Telephone follow up appointment with care management team member scheduled for:12/24/2020       [phone], LCSW Care Management & Coordination  [facility] / [facility]   [phone] 2:29 PM

## 2020-12-10 NOTE — Patient Instructions (Signed)
It was great seeing you today.  I am sorry you are having these issues with your back pain.  You have what is called a lumbar radiculopathy.  I want to switch your antidepressant medication to a medication that helps with depression but also with nerve pain called Cymbalta.  I have also placed a referral for physical therapy and someone will be calling you to schedule an appointment.  I would like to follow-up with you on this and approximately 2 months.  Regarding your GI issues please be sure to follow-up with the gastroenterology doctors.  Regarding your depression please let me know if your depressive symptoms worsen, if you have any thoughts of self-harm please reach out.  Below is information on contacts for this.  I hope these treatments help with your back pain and her depression.  I hope you have a wonderful afternoon and if you have any questions or concerns please feel free to call the clinic.  If you are feeling suicidal or depression symptoms worsen please immediately go to:   If you are thinking about harming yourself or having thoughts of suicide, or if you know someone who is, seek help right away. If you are in crisis, make sure you are not left alone.  If someone else is in crisis, make sure he/she/they is not left alone  Call 988 OR 1-800-273-TALK  24 Hour Availability for Walk-IN services  [facility]  [address] [place], [place] [phone] Crisis [phone]    Other crisis resources:  [facility] [employer] (Domestic Violence, Rape & Victim Assistance [phone]  [facility]    (ONLY from 8am-4pm)    [phone]  [facility] Mobile Crisis Unit (24/7)   [phone]  [place] National Suicide Hotline   [phone] Len Childs)

## 2020-12-10 NOTE — Patient Instructions (Signed)
Licensed Clinical Social Worker Visit Information  Goals we discussed today:   Goals Addressed             This Visit's Progress    Begin and Stick with Counseling-Depression       Timeframe:  Short-Term Goal Priority:  High Start Date:  12/10/2020                           Expected End Date:                       Follow Up Date 12/24/20  Patient Goals/Self-Care Activities: Call [facility] [phone] to schedule your medication management appointment Call to schedule your counseling appointment from the list provided Continue with compliance of taking medication  Review your EMMI educational information on breathing to relax, Look for an e-mail from [facility]  Why is this important?   Beating depression may take some time.  If you don't feel better right away, don't give up on your treatment plan.       Mr. Darley was given information about Care Management services today including:  Care Management services include personalized support from designated clinical staff supervised by his physician, including individualized plan of care and coordination with other care providers 24/7 contact phone numbers for assistance for urgent and routine care needs. The patient may stop Care Management services at any time by phone call to the office staff.   Patient agreed to services and verbal consent obtained.  Patient verbalizes understanding of instructions provided today.   Follow up plan: Appointment scheduled for SW follow up with client by phone on: 12/24/2020   Sammuel Hines, LCSW Care Management & Coordination  [phone]

## 2020-12-11 DIAGNOSIS — M5416 Radiculopathy, lumbar region: Secondary | ICD-10-CM | POA: Insufficient documentation

## 2020-12-11 NOTE — Assessment & Plan Note (Signed)
Patient with chronic low back pain complaining of pain shooting from his back down his right lower extremity.  Physical exam consistent with lumbar radiculopathy.  Pain has been going on for 1 year with worsening radiculopathy pain over the last month.  We will transition patient's antidepressant to Cymbalta to hopefully help with some neuropathic pain.  I have also placed referral for physical therapy.  We will follow-up in 2 months.  Discussed red flag symptoms and strict return precautions/ED precautions.  Patient is agreeable to this.  If pain does not improve may require imaging and referral to neurosurgery.

## 2020-12-24 ENCOUNTER — Ambulatory Visit: Payer: BC Managed Care – PPO | Admitting: Licensed Clinical Social Worker

## 2020-12-24 DIAGNOSIS — Z789 Other specified health status: Secondary | ICD-10-CM

## 2020-12-24 NOTE — Chronic Care Management (AMB) (Signed)
Care Management   Clinical Social Work Note  12/24/2020 Name: Darrell Monroe MRN: [id number] DOB: 11-16-1985  Darrell Monroe is a 35 y.o. year old male who is a primary care patient of Derrel Nip, MD. The CCM team was consulted to assist the patient with chronic disease management and/or care coordination needs related to: Mental Health Counseling and Resources.   Engaged with patient by telephone for follow up visit in response to provider referral for social work chronic care management and care coordination services.   Consent to Services:  The patient was given information about Chronic Care Management services, agreed to services, and gave verbal consent prior to initiation of services.  Please see initial visit note for detailed documentation.   Patient agreed to services and consent obtained.   Assessment:  Patient continues to experience difficulty with connecting with mental health providers.Contiues to decline assistance from LCSW. States he will make the call. See Care Plan below for interventions and patient self-care actives.  Follow up Plan: Patient would like continued follow-up from CCM LCSW .  Follow up scheduled in one week per patient's request. Patient will call office if needed prior to next encounter.   Review of patient past medical history, allergies, medications, and health status, including review of relevant consultants reports was performed today as part of a comprehensive evaluation and provision of chronic care management and care coordination services.     SDOH (Social Determinants of Health) assessments and interventions performed:    Advanced Directives Status: Not addressed in this encounter.  CCM Care Plan  No Known Allergies  Outpatient Encounter Medications as of 12/24/2020  Medication Sig   DULoxetine (CYMBALTA) 30 MG capsule Take 1 capsule (30 mg total) by mouth daily.   linaclotide (LINZESS) 290 MCG CAPS capsule TAKE 1 CAPSULE BY MOUTH ONCE  A DAY WITH FIRST MEAL 30 DAYS (Patient not taking: Reported on 11/16/2020)   pantoprazole (PROTONIX) 40 MG tablet TAKE 1 TABLET BY MOUTH ONCE A DAY 30 DAY(S) (Patient not taking: Reported on 11/16/2020)   Vitamin D, Ergocalciferol, (DRISDOL) 1.25 MG (50000 UNIT) CAPS capsule Take 50,000 Units by mouth every Thursday.   No facility-administered encounter medications on file as of 12/24/2020.    Patient Active Problem List   Diagnosis Date Noted   Lumbar radiculopathy 12/11/2020   Encounter to establish care with new doctor 11/16/2020   Generalized anxiety disorder 11/16/2020   Bright red blood per rectum 11/16/2020   Depression, major, single episode, in partial remission (HCC) 11/16/2020   Routine screening for STI (sexually transmitted infection) 11/16/2020    Conditions to be addressed/monitored:  Mental Health Concerns   Care Plan : General Social Work (Adult)  Updates made by Soundra Pilon, LCSW since 12/24/2020 12:00 AM   Problem: Emotional Distress    Goal: Emotional Health Supported by connecting with Mental Health Provider   Start Date: 12/10/2020  This Visit's Progress: Not on track  Recent Progress: On track  Priority: High  Current Barriers:  Working and has not been able to call to schedule appointment Declines assistance from LCSW  Care Coordination needs related to Mental Health Concerns  in a patient with Anxiety and Depression Social Work CM Clinical Goal(s):  Patient will work with Licensed Visual merchandiser to connect with Mental health provider to address needs related to Anxiety and Depression  Interventions: Inter-disciplinary care team collaboration (see longitudinal plan of care) Assessed patient's progress and barriers to care  Review various resources, discussed  options and provided patient information about  mental health providers based on patient's insurance [facility]) Mindfulness or Relaxation Training, Reviewed mental health medications with  patient and discussed compliance: Patient is currently taking 40mg  of Celexa;, Participation in counseling encouraged , Provided EMMI education information on breathing to relax, and Discussed referral to [facility] to assist with connecting to mental health provider ; Patient would like to call providers on list and does not want [facility] referral at this time. Patient Goals/Self-Care Activities: Call [facility] [phone] to schedule your medication management appointment Call to schedule your counseling appointment from the list provided Continue with compliance of taking medication  Review your EMMI educational information on breathing to relax, Look for an e-mail from [facility]      [phone], Sammuel Hines Care Management & Coordination  [facility] / [facility]   [phone] 1:33 PM

## 2020-12-24 NOTE — Patient Instructions (Signed)
Visit Information   Goals Addressed             This Visit's Progress    Begin and Stick with Counseling-Depression   Not on track    Timeframe:  Short-Term Goal Priority:  High Start Date:  12/10/2020                           Expected End Date:                       Patient Goals/Self-Care Activities: Call [facility] [phone] to schedule your medication management appointment Call to schedule your counseling appointment from the list provided Continue with compliance of taking medication  Review your EMMI educational information on breathing to relax, Look for an e-mail from [facility]  Why is this important?   Beating depression may take some time.  If you don't feel better right away, don't give up on your treatment plan.       Patient verbalizes understanding of instructions provided today    Telephone follow up appointment with care management team member scheduled for:12/31/2020  Sammuel Hines, LCSW Care Management & Coordination  [phone]

## 2020-12-31 ENCOUNTER — Ambulatory Visit: Payer: BC Managed Care – PPO | Admitting: Licensed Clinical Social Worker

## 2020-12-31 DIAGNOSIS — Z7189 Other specified counseling: Secondary | ICD-10-CM

## 2020-12-31 NOTE — Patient Instructions (Signed)
Visit Information   Goals Addressed             This Visit's Progress    Begin and Stick with Counseling-Depression   Not on track    Timeframe:  Short-Term Goal Priority:  High Start Date:  12/10/2020                           Expected End Date:                       Patient Goals/Self-Care Activities: Call [facility] [phone] to schedule your medication management appointment Call to schedule your counseling appointment from the list provided Continue with compliance of taking medication  Review your EMMI educational information on breathing to relax, Look for an e-mail from [facility]  Why is this important?   Beating depression may take some time.  If you don't feel better right away, don't give up on your treatment plan.           Patient verbalizes understanding of instructions provided today.  No further follow up required: by LCSW  Sammuel Hines, LCSW Care Management & Coordination  [phone]

## 2020-12-31 NOTE — Chronic Care Management (AMB) (Signed)
Care Management   Clinical Social Work Note  12/31/2020 Name: Darrell Monroe MRN: [id number] DOB: [birth year]  Darrell Monroe is a 35 y.o. year old male who is a primary care patient of Derrel Nip, MD. The CCM team was consulted to assist the patient with chronic disease management and/or care coordination needs related to: Mental Health Counseling and Resources.   Engaged with patient by telephone for follow up visit in response to provider referral for social work chronic care management and care coordination services.   Consent to Services:  The patient was given information about Chronic Care Management services, agreed to services, and gave verbal consent prior to initiation of services.  Please see initial visit note for detailed documentation.   Patient agreed to services and consent obtained.   Assessment: Patient is not making progress with connecting with mental health provider. He continues to decline assistance with connecting to provider.  See Care Plan below for interventions and patient self-care actives.  Recent life changes /stressors: continue to work lots of hours  Follow up Plan:  Patient does not require or desire continued follow-up. Will contact the office if needed,  Patient may benefit from CCM LCSW remaining part of care team for the next 60 days in the event patient decides he would like assistance.  If no needs are identified in the next 60 days, CCM LCSW will disconnect from the care team.   Review of patient past medical history, allergies, medications, and health status, including review of relevant consultants reports was performed today as part of a comprehensive evaluation and provision of chronic care management and care coordination services.     SDOH (Social Determinants of Health) assessments and interventions performed:    Advanced Directives Status: Not addressed in this encounter.  CCM Care Plan  No Known Allergies  Outpatient Encounter  Medications as of 12/31/2020  Medication Sig   DULoxetine (CYMBALTA) 30 MG capsule Take 1 capsule (30 mg total) by mouth daily.   linaclotide (LINZESS) 290 MCG CAPS capsule TAKE 1 CAPSULE BY MOUTH ONCE A DAY WITH FIRST MEAL 30 DAYS (Patient not taking: Reported on 11/16/2020)   pantoprazole (PROTONIX) 40 MG tablet TAKE 1 TABLET BY MOUTH ONCE A DAY 30 DAY(S) (Patient not taking: Reported on 11/16/2020)   Vitamin D, Ergocalciferol, (DRISDOL) 1.25 MG (50000 UNIT) CAPS capsule Take 50,000 Units by mouth every Thursday.   No facility-administered encounter medications on file as of 12/31/2020.    Patient Active Problem List   Diagnosis Date Noted   Lumbar radiculopathy 12/11/2020   Encounter to establish care with new doctor 11/16/2020   Generalized anxiety disorder 11/16/2020   Bright red blood per rectum 11/16/2020   Depression, major, single episode, in partial remission (HCC) 11/16/2020   Routine screening for STI (sexually transmitted infection) 11/16/2020    Conditions to be addressed/monitored: Depression and Bipolar Disorder;   Care Plan : General Social Work (Adult)  Updates made by Soundra Pilon, LCSW since 12/31/2020 12:00 AM     Problem: Emotional Distress      Goal: Emotional Health Supported by connecting with Mental Health Provider   Start Date: 12/10/2020  This Visit's Progress: Not on track  Recent Progress: Not on track  Priority: High  Note:   Current Barriers:  Working and has not been able to call to schedule appointment Declines assistance from LCSW  Care Coordination needs related to Mental Health Concerns  in a patient with Anxiety and Depression Social Work CM  Clinical Goal(s):  Patient will work with Licensed Clinical Social Worker to connect with Mental health provider to address needs related to Anxiety and Depression  Interventions: Inter-disciplinary care team collaboration (see longitudinal plan of care) Assessed patient's progress and barriers to care   Has contact information for [facility] for medication management  Mindfulness or Relaxation Training, Reviewed mental health medications with patient and discussed compliance: Patient is currently taking 40mg  of Celexa;, Participation in counseling encouraged , Provided EMMI education information on breathing to relax, and Discussed referral to [facility] to assist with connecting to mental health provider ; Patient would like to call providers on list and does not want [facility] referral at this time. Patient Goals/Self-Care Activities: Call [facility] [phone] to schedule your medication management appointment Call to schedule your counseling appointment from the list provided Continue with compliance of taking medication  Review your EMMI educational information on breathing to relax, Look for an e-mail from [facility]     [phone], Sammuel Hines Care Management & Coordination  [facility] / [facility]   [phone] 3:41 PM

## 2021-01-19 ENCOUNTER — Other Ambulatory Visit: Payer: Self-pay

## 2021-01-19 ENCOUNTER — Ambulatory Visit: Payer: BC Managed Care – PPO | Admitting: Family Medicine

## 2021-01-19 DIAGNOSIS — R29898 Other symptoms and signs involving the musculoskeletal system: Secondary | ICD-10-CM

## 2021-01-19 NOTE — Progress Notes (Signed)
SUBJECTIVE:   CHIEF COMPLAINT / HPI:   Back pain and right leg pain - back pain with sitting down - first happened a couple of years ago - used to take vitamin D 50,000 units weekly, this helped a lot - has been out of the vitamin D for about two months - back pain aggravating factors: sitting down for too long - lifting weights is fine, heavy stuff no big deal  - right leg pain: shooting pain posterior leg from hip to knee happens when he stands up and walks - right leg relieving factors: walking for long time, if he slows down it hurts  - no known trauma, injury - no numbness or tingling in legs with this back pain or leg pain - some missing steps because of the right leg, feels weak - right leg has given out on him before; happened during work after standing up after a period of sitting about 1-2 months ago - no imaging of the back ever - no urinary or BM incontinence  - some saddle anesthesia when he sits down; happens when sitting down gets  better when he stands up - no family history osteopenia/osteoporosis, bone cancer, no tumors  - uncle died of prostate cancer  PHQ-9 positive for suicidal ideation - Patient reports he has been dealing with depression and anxiety for couple years now - Takes medicine for this - He indicates that he simply answered honestly, he has intermittent passive suicidal ideation - He is not too concerned by this, says this is his baseline - No active plan - Feels safe at home  PERTINENT  PMH / PSH: Lumbar radiculopathy, depression and anxiety  OBJECTIVE:   BP 122/78   Pulse 88   Wt (!) 310 lb 6.4 oz (140.8 kg)   SpO2 98%   BMI 48.62 kg/m    PHQ-9:  Depression screen [facility] 2/9 01/19/2021 12/10/2020 11/16/2020  Decreased Interest 1 1 1   Down, Depressed, Hopeless 2 2 2   PHQ - 2 Score 3 3 3   Altered sleeping 0 3 3  Tired, decreased energy 2 2 3   Change in appetite 2 3 3   Feeling bad or failure about yourself  2 2 3   Trouble  concentrating 3 2 2   Moving slowly or fidgety/restless 2 2 3   Suicidal thoughts 1 0 -  PHQ-9 Score 15 17 20   Difficult doing work/chores Somewhat difficult Somewhat difficult -     GAD-7: No flowsheet data found.   Physical Exam General: Awake, alert, oriented, no acute distress MSK: Slow to rise from chair, normal gait, exquisite tenderness to palpation over lumbosacral spinal processes, some TTP over right paraspinal musculature Extremities: No bilateral lower extremity edema, palpable pedal and pretibial pulses bilaterally Neuro: Cranial nerves II through X grossly intact, weakened right hip flexion (4/5 on right, 5/5 on left), weakened right knee flexion/extension (4/5 on right, 5/5 on left), equal ankle extension/flexion (5/5)  ASSESSMENT/PLAN:   Right proximal leg weakness On physical exam. Strength 4/5 on right of hip and knee.  Patient with history of right leg giving out upon standing.  Also associated with saddle anesthesia after periods of sitting down, often improved with standing or walking but not always.  Given midline lumbosacral TTP, weakened right hip/knee, periods of saddle anesthesia needs MRI quickly.  Unable to do this quickly on an outpatient basis because of prior auths and insurance. Sent to ED.  Patient agreeable, verbalized understanding.  Instructed to go to either [facility]  or [facility] as those EDs have access to MRI.     Fayette Pho, MD [facility]

## 2021-01-19 NOTE — Patient Instructions (Addendum)
It was wonderful to meet you today. Thank you for allowing me to be a part of your care. Below is a short summary of what we discussed at your visit today:  Low back pain with right leg weakness and saddle anesthesia - The symptoms you are telling me are concerning enough that I want you to go to the ED for an MRI - There is too much red tape with prior authorizations as an outpatient doctor to get you an MRI of your back quickly - I believe going to the ED is the best way to get imaging in a decent timeframe and consultation if you need it - I have also sent an anti-inflammatory to your pharmacy called Mobic.  This is a once a day anti-inflammatory pill.    If you have any questions or concerns, please do not hesitate to contact us via phone or MyChart message.   Fayette Pho, MD

## 2021-01-19 NOTE — Assessment & Plan Note (Signed)
On physical exam. Strength 4/5 on right of hip and knee.  Patient with history of right leg giving out upon standing.  Also associated with saddle anesthesia after periods of sitting down, often improved with standing or walking but not always.  Given midline lumbosacral TTP, weakened right hip/knee, periods of saddle anesthesia needs MRI quickly.  Unable to do this quickly on an outpatient basis because of prior auths and insurance. Sent to ED.  Patient agreeable, verbalized understanding.  Instructed to go to either [facility] or [facility] as those EDs have access to MRI.

## 2021-01-28 ENCOUNTER — Other Ambulatory Visit (HOSPITAL_COMMUNITY): Payer: Self-pay

## 2021-01-28 ENCOUNTER — Other Ambulatory Visit: Payer: Self-pay

## 2021-01-29 ENCOUNTER — Other Ambulatory Visit: Payer: Self-pay | Admitting: Family Medicine

## 2021-01-29 ENCOUNTER — Other Ambulatory Visit (HOSPITAL_COMMUNITY): Payer: Self-pay

## 2021-01-29 MED ORDER — VITAMIN D (ERGOCALCIFEROL) 1.25 MG (50000 UNIT) PO CAPS
50000.0000 [IU] | ORAL_CAPSULE | ORAL | 5 refills | Status: AC
  Filled 2021-01-29 – 2021-03-04 (×2): qty 4, 28d supply, fill #0
  Filled 2021-03-26: qty 4, 28d supply, fill #1

## 2021-02-08 ENCOUNTER — Other Ambulatory Visit (HOSPITAL_COMMUNITY): Payer: Self-pay

## 2021-03-04 ENCOUNTER — Other Ambulatory Visit (HOSPITAL_COMMUNITY): Payer: Self-pay

## 2021-03-16 ENCOUNTER — Encounter (HOSPITAL_COMMUNITY): Payer: Self-pay

## 2021-03-16 ENCOUNTER — Other Ambulatory Visit: Payer: Self-pay

## 2021-03-16 ENCOUNTER — Emergency Department (HOSPITAL_COMMUNITY)
Admission: EM | Admit: 2021-03-16 | Discharge: 2021-03-16 | Disposition: A | Discharge status: Home / Self Care | Payer: BC Managed Care – PPO | Attending: Emergency Medicine | Admitting: Emergency Medicine

## 2021-03-16 DIAGNOSIS — M5459 Other low back pain: Secondary | ICD-10-CM | POA: Diagnosis not present

## 2021-03-16 DIAGNOSIS — K921 Melena: Secondary | ICD-10-CM | POA: Insufficient documentation

## 2021-03-16 DIAGNOSIS — K625 Hemorrhage of anus and rectum: Secondary | ICD-10-CM | POA: Diagnosis not present

## 2021-03-16 DIAGNOSIS — K922 Gastrointestinal hemorrhage, unspecified: Secondary | ICD-10-CM | POA: Diagnosis not present

## 2021-03-16 DIAGNOSIS — M545 Low back pain, unspecified: Secondary | ICD-10-CM | POA: Diagnosis not present

## 2021-03-16 HISTORY — DX: Unspecified hemorrhoids: K64.9

## 2021-03-16 LAB — CBC
HCT: 48.4 % (ref 39.0–52.0)
Hemoglobin: 15.2 g/dL (ref 13.0–17.0)
MCH: 26 pg (ref 26.0–34.0)
MCHC: 31.4 g/dL (ref 30.0–36.0)
MCV: 82.7 fL (ref 80.0–100.0)
Platelets: 312 10*3/uL (ref 150–400)
RBC: 5.85 MIL/uL — ABNORMAL HIGH (ref 4.22–5.81)
RDW: 14.9 % (ref 11.5–15.5)
WBC: 5.1 10*3/uL (ref 4.0–10.5)
nRBC: 0 % (ref 0.0–0.2)

## 2021-03-16 LAB — TYPE AND SCREEN
ABO/RH(D): B POS
Antibody Screen: NEGATIVE

## 2021-03-16 LAB — POC OCCULT BLOOD, ED: Fecal Occult Bld: POSITIVE — AB

## 2021-03-16 LAB — COMPREHENSIVE METABOLIC PANEL
ALT: 50 U/L — ABNORMAL HIGH (ref 0–44)
AST: 34 U/L (ref 15–41)
Albumin: 4.1 g/dL (ref 3.5–5.0)
Alkaline Phosphatase: 53 U/L (ref 38–126)
Anion gap: 7 (ref 5–15)
BUN: 19 mg/dL (ref 6–20)
CO2: 28 mmol/L (ref 22–32)
Calcium: 8.7 mg/dL — ABNORMAL LOW (ref 8.9–10.3)
Chloride: 102 mmol/L (ref 98–111)
Creatinine, Ser: 1.06 mg/dL (ref 0.61–1.24)
GFR, Estimated: >60 mL/min (ref >60)
Glucose, Bld: 123 mg/dL — ABNORMAL HIGH (ref 70–99)
Potassium: 4.1 mmol/L (ref 3.5–5.1)
Sodium: 137 mmol/L (ref 135–145)
Total Bilirubin: 0.3 mg/dL (ref 0.3–1.2)
Total Protein: 8 g/dL (ref 6.5–8.1)

## 2021-03-16 MED ORDER — METHOCARBAMOL 500 MG PO TABS: 500.0000 mg | ORAL_TABLET | Freq: Two times a day (BID) | ORAL | 0 refills | Status: AC

## 2021-03-16 NOTE — ED Triage Notes (Signed)
Patient c/o left lower back pain x 1 week. Patient denies any injury or heavy lifting. Patient denies pain radiating down his left leg, but states "ironically I am having pain in the right leg."  Patient added that he had bright red blood with small clots in his stool yesterday and then again today.

## 2021-03-16 NOTE — Discharge Instructions (Addendum)
Avoid NSAID medications (ibuprofen, naprosyn, aspirin) until the cause of your bleeding is determined. You can try over the counter tylenol/Acetaminophen in addition to the muscle relaxer to help with your back pain.

## 2021-03-16 NOTE — ED Provider Notes (Signed)
[facility] EMERGENCY DEPARTMENT Provider Note  CSN: [id number] Arrival date & time: 03/16/21 4854    History Chief Complaint  Patient presents with   Back Pain   Blood In Stools    Darrell Monroe is a 35 y.o. male presents for evaluation of low back pain and BRBPR. He reports several days of aching L lowe back pain, worse with movement. Not radiating into his leg, not associated with numbness or tingling. Not improved with naprosyn.   He has also noted some blood mixed with his stool the last few days. No hard stool. No melena. He denies any blood thinners. Has had similar before told it wasn't hemorrhoids but has never followed with GI or had a colonoscopy despite being referred in the past.    Past Medical History:  Diagnosis Date   Bright red blood per rectum 11/16/2020   Encounter to establish care with new doctor 11/16/2020   Hemorrhoid     History reviewed. No pertinent surgical history.  Family History  Problem Relation Age of Onset   Hypertension Mother    Diabetes Mother     Social History   Tobacco Use   Smoking status: Never   Smokeless tobacco: Never  Vaping Use   Vaping Use: Never used  Substance Use Topics   Alcohol use: No   Drug use: No     Home Medications Prior to Admission medications   Medication Sig Start Date End Date Taking? Authorizing Provider  methocarbamol (ROBAXIN) 500 MG tablet Take 1 tablet (500 mg total) by mouth 2 (two) times daily. 03/16/21  Yes Pollyann Savoy, MD  DULoxetine (CYMBALTA) 30 MG capsule Take 1 capsule (30 mg total) by mouth daily. 12/10/20   Derrel Nip, MD  pantoprazole (PROTONIX) 40 MG tablet TAKE 1 TABLET BY MOUTH ONCE A DAY 30 DAY(S) Patient not taking: Reported on 11/16/2020 06/18/20 06/18/21  Charlott Rakes, MD  Vitamin D, Ergocalciferol, (DRISDOL) 1.25 MG (50000 UNIT) CAPS capsule Take 50,000 Units by mouth every Thursday. 07/21/20   [provider]  Vitamin D, Ergocalciferol, (DRISDOL)  1.25 MG (50000 UNIT) CAPS capsule Take 1 capsule (50,000 Units total) by mouth once a week. 01/29/21   Derrel Nip, MD     Allergies    Other   Review of Systems   Review of Systems A comprehensive review of systems was completed and negative except as noted in HPI.    Physical Exam BP (!) 170/97   Pulse 83   Temp 98.6 F (37 C) (Oral)   Resp 16   Ht 5\' 5"  (1.651 m)   Wt (!) 143 kg   SpO2 99%   BMI 52.45 kg/m   Physical Exam Vitals and nursing note reviewed.  Constitutional:      Appearance: Normal appearance.  HENT:     Head: Normocephalic and atraumatic.     Nose: Nose normal.     Mouth/Throat:     Mouth: Mucous membranes are moist.  Eyes:     Extraocular Movements: Extraocular movements intact.     Conjunctiva/sclera: Conjunctivae normal.  Cardiovascular:     Rate and Rhythm: Normal rate.  Pulmonary:     Effort: Pulmonary effort is normal.     Breath sounds: Normal breath sounds.  Abdominal:     General: Abdomen is flat.     Palpations: Abdomen is soft.     Tenderness: There is no abdominal tenderness.  Genitourinary:    Comments: No external hemorrhoids or fissures, no  mass or tenderness on digital exam. Soft brown stool Musculoskeletal:        General: Tenderness (L lumbar paraspinal muscles) present. No swelling. Normal range of motion.     Cervical back: Neck supple.  Skin:    General: Skin is warm and dry.  Neurological:     General: No focal deficit present.     Mental Status: He is alert.  Psychiatric:        Mood and Affect: Mood normal.     ED Results / Procedures / Treatments   Labs (all labs ordered are listed, but only abnormal results are displayed) Labs Reviewed  COMPREHENSIVE METABOLIC PANEL - Abnormal; Notable for the following components:      Result Value   Glucose, Bld 123 (*)    Calcium 8.7 (*)    ALT 50 (*)    All other components within normal limits  CBC - Abnormal; Notable for the following components:   RBC 5.85  (*)    All other components within normal limits  POC OCCULT BLOOD, ED - Abnormal; Notable for the following components:   Fecal Occult Bld POSITIVE (*)    All other components within normal limits  TYPE AND SCREEN    EKG None  Radiology No results found.  Procedures Procedures  Medications Ordered in the ED Medications - No data to display   MDM Rules/Calculators/A&P MDM Patient with MSK low back pain. Will treat with APAP and muscle relaxers. Avoid NSAIDs due to GI bleeding. Check labs and hemoccult.   ED Course  I have reviewed the triage vital signs and the nursing notes.  Pertinent labs & imaging results that were available during my care of the patient were reviewed by me and considered in my medical decision making (see chart for details).  Clinical Course as of 03/16/21 1052  Tue Mar 16, 2021  1024 CBC with normal Hgb, hemoccult is positive.  [CS]  1030 CMP is unremarkable.  [CS]  1045 Patient with BRBPR but no on blood thinners, no anemia and hemodynamically stable. Will refer to GI to complete his evaluation. Rx for Robaxin for his back pain. APAP OTC.  [CS]    Clinical Course User Index [CS] Pollyann Savoy, MD    Final Clinical Impression(s) / ED Diagnoses Final diagnoses:  Blood in stool  Acute left-sided low back pain without sciatica    Rx / DC Orders ED Discharge Orders          Ordered    methocarbamol (ROBAXIN) 500 MG tablet  2 times daily        03/16/21 1051             Pollyann Savoy, MD 03/16/21 1052

## 2021-03-17 ENCOUNTER — Ambulatory Visit: Payer: BC Managed Care – PPO | Admitting: Student

## 2021-03-17 ENCOUNTER — Other Ambulatory Visit: Payer: Self-pay

## 2021-03-17 ENCOUNTER — Ambulatory Visit (INDEPENDENT_AMBULATORY_CARE_PROVIDER_SITE_OTHER): Payer: BC Managed Care – PPO

## 2021-03-17 VITALS — BP 122/80 | HR 63 | Ht 65.0 in | Wt 313.0 lb

## 2021-03-17 DIAGNOSIS — Z23 Encounter for immunization: Secondary | ICD-10-CM

## 2021-03-17 DIAGNOSIS — M5416 Radiculopathy, lumbar region: Secondary | ICD-10-CM

## 2021-03-17 DIAGNOSIS — K625 Hemorrhage of anus and rectum: Secondary | ICD-10-CM | POA: Diagnosis not present

## 2021-03-17 DIAGNOSIS — R030 Elevated blood-pressure reading, without diagnosis of hypertension: Secondary | ICD-10-CM

## 2021-03-17 MED ORDER — FAMOTIDINE 40 MG PO TABS: 40.0000 mg | ORAL_TABLET | Freq: Every day | ORAL | 1 refills | Status: DC

## 2021-03-17 NOTE — Patient Instructions (Signed)
It was great to see you! Thank you for allowing me to participate in your care!   I recommend that you always bring your medications to each appointment as this makes it easy to ensure we are on the correct medications and helps [place] not miss when refills are needed.  Our plans for today:  - I reordered your MRI of the back, please get this done. Call [facility] to schedule MRI at [phone] - You may take tylenol every 6-8 hours (no more than 4 grams a day). Please hold off on taking naproxen until your GI appointment - We will follow up in 2 weeks for your blood pressure, I have added a DASH eating plan for you, weight loss can also help with lowering blood pressure - [facility] number is [phone] please reach out to schedule an appointment -take famotidine 40 mg a day for 8 weeks   Please get help if:   You have increased bleeding One or both of your legs or feet feel weak. One or both of your legs or feet lose feeling (have numbness). You have trouble controlling when you poop (have a bowel movement) or pee (urinate). You have bad back pain and: You feel like you may vomit (nauseous), or you vomit. You have pain in your belly (abdomen). You have shortness of breath. You faint.   Take care and seek immediate care sooner if you develop any concerns. Please remember to show up 15 minutes before your scheduled appointment time!  Levin Erp, MD [facility]

## 2021-03-17 NOTE — Progress Notes (Addendum)
SUBJECTIVE:   CHIEF COMPLAINT / HPI:   ED Follow Up For BP, Bloody Stools and Back Pain  Bloody Stools Presented to the hospital yesterday 10/18 for back pain and bloody stools. No anemia at the time. Was given APAP and muscle relaxer in ED and has not had colonoscopy before or seen GI although referred in June by [facility]. Hasn't had BM today, last one was last night around 8 pm with blood dark clumps and light red. Been going for months and stopped. Uses naproxen 1000 mg a day - otc, since June for his back pain. Had much more blood in stool yesterday and that was the reason he went to the ED.   Blood pressure BP in ED yesterday was 170/97. Has been having headaches since Sunday on and off. Has no vision changes, no chest pain, and has some shortness of breath due to nasal congestion.   Back/Shoulder Pain Chronic lower back pain. No issues with urination or bowel movement. Has sensation behind thighs and perineal region per patient, no symptoms of saddle anesthesia per patient when sitting or standing. Sometimes has numbness in left leg every now and then when standing or witting. Has some limping when walking. Has no numbness. Shoulder pain started Sunday. No radiating of pain and no numbness in fingers. Has not been able to obtain MRI ordered at last visit. He says he lifted something at work 2 years ago and felt a shock and since then has been having this back pain.  PERTINENT  PMH / PSH: lumbar radiculopathy, depression anxiety  OBJECTIVE:   BP 122/80   Pulse 63   Ht 5\' 5"  (1.651 m)   Wt (!) 313 lb (142 kg)   SpO2 95%   BMI 52.09 kg/m   General: Non-toxic, NAD, awake, alert, responsive to questions Head: Normocephalic atraumatic CV: Regular rate and rhythm no murmurs rubs or gallops Respiratory: Clear to ausculation bilaterally, no wheezes rales or crackles, no increased work of breathing, 2+ LE pulses Abdomen: Soft, mildly tender on left quadrant, non-distended, no gaurding or  rebound, normoactive bowel sounds  Extremities: Moves upper and lower extremities freely, no edema in LE Neuro: Gait with limp with left leg pain, no facial weakness or symmetry issues, patellar reflexes 2+, sensation intact on legs bilaterally Skin: No rashes or lesions visualized   ASSESSMENT/PLAN:   Lumbar radiculopathy Chronic lower back pain consistent with lumbar radiculopathy. No saddle anesthesia, BM or urinary issues. Patellar reflexes 2+. Left paraspinal muscle tenderness on examination. Says pain started 2 years ago with a work heavy lifting incident. -continue cymbalta -reordered MRI w/o contrast for patient and provided [facility] number in AVS for patient to call -advised tylenol for pain control (max 4 g/day) -ED provided short course of robaxin  Elevated blood pressure reading Was 170/97 in ED on 10/18. In clinic today was 150/90 initially but at end of visit was 122/80. Likely due to pain as patient had not taken any pain/anxiety medications today. -advised DASH diet/lifestyle modifications on weight loss -2 week follow up  Bright red blood per rectum Longstanding issue for him, went to ED yesterday for this but had no anemia and was sent home with APAP, robaxin, and GI f/u.  -Famotidine 40 mg daily for 8 weeks -GI referral placed in June. Discussed referral with referral specialist who recommended that the patient call GI's office for appointment. Provided [facility] number in AVS.  -advised against naproxen use until GI appointment, advised tylenol for  pain control (no more than 4g daily)     Levin Erp, MD [facility]

## 2021-03-18 DIAGNOSIS — R030 Elevated blood-pressure reading, without diagnosis of hypertension: Secondary | ICD-10-CM | POA: Insufficient documentation

## 2021-03-18 NOTE — Assessment & Plan Note (Addendum)
Longstanding issue for him, went to ED yesterday for this but had no anemia and was sent home with APAP, robaxin, and GI f/u.  -Famotidine 40 mg daily for 8 weeks -GI referral placed in June. Discussed referral with referral specialist who recommended that the patient call GI's office for appointment. Provided [place] [facility] number in AVS.  -advised against naproxen use until GI appointment, advised tylenol for pain control (no more than 4g daily)

## 2021-03-18 NOTE — Assessment & Plan Note (Signed)
Chronic lower back pain consistent with lumbar radiculopathy. No saddle anesthesia, BM or urinary issues. Patellar reflexes 2+. Left paraspinal muscle tenderness on examination. Says pain started 2 years ago with a work heavy lifting incident. -continue cymbalta -reordered MRI w/o contrast for patient and provided [facility] number in AVS for patient to call -advised tylenol for pain control (max 4 g/day) -ED provided short course of robaxin

## 2021-03-18 NOTE — Assessment & Plan Note (Signed)
Was 170/97 in ED on 10/18. In clinic today was 150/90 initially but at end of visit was 122/80. Likely due to pain as patient had not taken any pain/anxiety medications today. -advised DASH diet/lifestyle modifications on weight loss -2 week follow up

## 2021-03-26 ENCOUNTER — Other Ambulatory Visit (HOSPITAL_COMMUNITY): Payer: Self-pay

## 2021-04-01 ENCOUNTER — Other Ambulatory Visit: Payer: BC Managed Care – PPO

## 2021-04-07 NOTE — Progress Notes (Deleted)
    SUBJECTIVE:   CHIEF COMPLAINT / HPI:   Blood pressure follow-up Patient presents to our clinic for blood pressure follow-up.Patient had an elevated blood pressure while in the ED on 10/18 of 170/97.  In the clinic at his last visit on 10/19 it was initially 150/90 but I rechecked later in the visit was 122/80.  I discussed Dash diet and lifestyle modifications recommended a follow-up in 2 weeks.  He is not on any antihypertensive medications.  PERTINENT  PMH / PSH: ***  OBJECTIVE:   There were no vitals taken for this visit.  ***  ASSESSMENT/PLAN:   No problem-specific Assessment & Plan notes found for this encounter.     Darrell Nip, MD [facility]

## 2021-04-09 ENCOUNTER — Ambulatory Visit: Payer: BC Managed Care – PPO | Admitting: Family Medicine

## 2021-04-15 ENCOUNTER — Ambulatory Visit
Admission: RE | Admit: 2021-04-15 | Discharge: 2021-04-15 | Disposition: A | Discharge status: Home / Self Care | Payer: BC Managed Care – PPO | Source: Ambulatory Visit | Attending: Family Medicine | Admitting: Family Medicine

## 2021-04-15 ENCOUNTER — Other Ambulatory Visit: Payer: Self-pay

## 2021-04-15 DIAGNOSIS — M5416 Radiculopathy, lumbar region: Secondary | ICD-10-CM

## 2021-04-15 DIAGNOSIS — M545 Low back pain, unspecified: Secondary | ICD-10-CM | POA: Diagnosis not present

## 2021-04-16 ENCOUNTER — Ambulatory Visit: Payer: BC Managed Care – PPO | Admitting: Family Medicine

## 2021-04-16 ENCOUNTER — Other Ambulatory Visit: Payer: Self-pay

## 2021-04-16 DIAGNOSIS — I10 Essential (primary) hypertension: Secondary | ICD-10-CM

## 2021-04-16 LAB — POCT GLYCOSYLATED HEMOGLOBIN (HGB A1C): Hemoglobin A1C: 5.9 % — AB (ref 4.0–5.6)

## 2021-04-16 MED ORDER — AMLODIPINE BESYLATE 5 MG PO TABS: 5.0000 mg | ORAL_TABLET | Freq: Every day | ORAL | 3 refills | Status: DC

## 2021-04-16 NOTE — Assessment & Plan Note (Signed)
Patient with elevated blood pressure in the emergency department as well as at previous office visit.  Initial blood pressure today on arrival was 170/100.  Repeat manual blood pressure was 146/82.  Given elevated blood pressures on multiple checks now meets criteria for hypertension.  We will start amlodipine 5 mg daily.  Discussed signs and symptoms of hypotension.  Follow-up in 2 to 4 weeks to assess blood pressure after medication usage.  Strict ED and return precautions given.  No further questions or concerns.

## 2021-04-16 NOTE — Assessment & Plan Note (Signed)
Patient's BMI of 52.72.  Previous blood glucose elevated.  Will check lipid panel as well as POC hemoglobin A1c.  POC hemoglobin A1c today was 5.9 making prediabetic.  Discussed dietary changes, exercise, weight loss.  We will continue to monitor.

## 2021-04-16 NOTE — Progress Notes (Signed)
    SUBJECTIVE:   CHIEF COMPLAINT / HPI:   Blood pressure follow-up Patient was seen in the emergency department and October and had blood pressure reading 170/97.  He was evaluated on 10/19 in our office and his blood pressure was initially mildly elevated at 150/90 but repeat was within normal limits.  The elevated blood pressure was most likely related to him being in pain as well as having anxiety regarding the visit.  It was requested that he return for further evaluation.  Since previous evaluation has been doing well.  He reports he recently had an MRI of his low back because of the considerable pain.  His pain has improved and he feels it intermittently at this time.  He also is taking medications for anxiety.  Reports regular episodes of headaches as well as intermittent episodes of blurry vision which have been going on for "a while".  He also reports occasional dizziness upon standing which resolves spontaneously.  He reports that he does drink plenty of fluids.  Denies any chest pain shortness of breath.   OBJECTIVE:   BP (!) 146/82 Comment: Manual blood pressure  Pulse 66   Ht 5\' 5"  (1.651 m)   Wt (!) 316 lb 12.8 oz (143.7 kg)   SpO2 98%   BMI 52.72 kg/m   General: Well-appearing 35 year old male, no acute distress Cardiac: Regular rate and rhythm, no murmurs appreciated Respiratory: Normal work of breathing, lungs clear to auscultation bilaterally Abdomen: Soft, nontender, positive bowel sounds  MSK: No gross abnormalities   ASSESSMENT/PLAN:   Primary hypertension Patient with elevated blood pressure in the emergency department as well as at previous office visit.  Initial blood pressure today on arrival was 170/100.  Repeat manual blood pressure was 146/82.  Given elevated blood pressures on multiple checks now meets criteria for hypertension.  We will start amlodipine 5 mg daily.  Discussed signs and symptoms of hypotension.  Follow-up in 2 to 4 weeks to assess blood  pressure after medication usage.  Strict ED and return precautions given.  No further questions or concerns.  Morbid obesity (HCC) Patient's BMI of 52.72.  Previous blood glucose elevated.  Will check lipid panel as well as POC hemoglobin A1c.  POC hemoglobin A1c today was 5.9 making prediabetic.  Discussed dietary changes, exercise, weight loss.  We will continue to monitor.    20, MD [facility]

## 2021-04-16 NOTE — Patient Instructions (Signed)
It was a pleasure seeing you today.  Your blood pressure was once again elevated and he now have a diagnosis of hypertension (high blood pressure) 1 discharged on a medication called amlodipine (Norvasc).  You will take 5 mg nightly.  I would like for you to follow-up in 2 to 4 weeks for another blood pressure check on your new medicine.  I am also going to check your cholesterol as well as your glucose control.  Your blood sugar was elevated at the previous check.   If you have any questions or concerns please feel free to call the clinic. Hope you have a wonderful afternoon!

## 2021-04-17 ENCOUNTER — Other Ambulatory Visit: Payer: Self-pay | Admitting: Student

## 2021-04-17 DIAGNOSIS — K625 Hemorrhage of anus and rectum: Secondary | ICD-10-CM

## 2021-04-17 LAB — LIPID PANEL
Chol/HDL Ratio: 4.1 ratio (ref 0.0–5.0)
Cholesterol, Total: 150 mg/dL (ref 100–199)
HDL: 37 mg/dL — ABNORMAL LOW (ref >39)
LDL Chol Calc (NIH): 99 mg/dL (ref 0–99)
Triglycerides: 70 mg/dL (ref 0–149)
VLDL Cholesterol Cal: 14 mg/dL (ref 5–40)

## 2021-04-21 ENCOUNTER — Telehealth: Payer: Self-pay | Admitting: Student

## 2021-04-21 NOTE — Telephone Encounter (Signed)
Called patient about MRI results will treat conservatively now but could refer to ortho for steroid injections if worsening. Has appointment on 12/2

## 2021-04-30 ENCOUNTER — Ambulatory Visit: Payer: BC Managed Care – PPO | Admitting: Student

## 2021-04-30 ENCOUNTER — Other Ambulatory Visit: Payer: Self-pay

## 2021-04-30 DIAGNOSIS — M5416 Radiculopathy, lumbar region: Secondary | ICD-10-CM | POA: Diagnosis not present

## 2021-04-30 DIAGNOSIS — I10 Essential (primary) hypertension: Secondary | ICD-10-CM

## 2021-04-30 NOTE — Assessment & Plan Note (Addendum)
Patient's MRI revealed L4-L5 disc herniation with L5 impingement and L5-S1 biforaminal impingement. Patient endorses significant improvement in his pain with job changes. Pain is intermittent and does not intefere with his daily function. He has no paresthesia and no signs of cornus medialis or claudia equina. On exam patient has 5/5 muscle strength on all extremities with intact sensation and DTR. No intervention necessary at this time. Patient is agreeable to discussed return precaution.

## 2021-04-30 NOTE — Progress Notes (Signed)
    SUBJECTIVE:   CHIEF COMPLAINT / HPI: Blood Pressure Follow-up  [name] 35 year old male who presents today for blood pressure follow-up. He was recently started on Amlodipine 5mg  for elevated for BP. Per patient his BP at home is usually in the  120s/70s. Could be higher when he is outside in public due to anxiety. Currently taking medication for anxiety. Patient endorses adherence to his medication as prescribed. Denies any headache, dizziness or leg swelling.   Patient recently completed an MRI study for worsening back pain. He is interested in discussing the result of his imagining. His pain is much improved since he switched job. Patient report worked at a which involved a lot of bending and carried heavy items. Since the switch from jobs the pain rarely comes on and very mild when they come on. He denies any tingling, numbness or incontinence.  PERTINENT  PMH / PSH: HTN, Depression, Lumbar radiculopathy   OBJECTIVE:   BP (!) 148/93   Pulse 72   Wt (!) 141.6 kg   SpO2 98%   BMI 51.95 kg/m    Physical Exam General: Alert, well appearing, NAD, Oriented x4 Cardiovascular: RRR, No Murmurs, Normal S2/S2 Respiratory: CTAB, No wheezing or Rales Abdomen: No distension or tenderness Extremities: No edema on extremities   Skin: Warm and dry  ASSESSMENT/PLAN:   Lumbar radiculopathy Patient's MRI revealed L4-L5 disc herniation with L5 impingement and L5-S1 biforaminal impingement. Patient endorses significant improvement in his pain with job changes. Pain is intermittent and does not intefere with his daily function. He has no paresthesia and no signs of cornus medialis or claudia equina. On exam patient has 5/5 muscle strength on all extremities with intact sensation and DTR. No intervention necessary at this time. Patient is agreeable to discussed return precaution.  Primary hypertension Patient BP is elevated today at 148/93. He reports home reading to be in the  120s/70s. He has history of anxiety and reports feeling anxious when he is outside. Patient was recently started on Amlodipine. Denies BLE, headache or dizziness. Reports adherences with his medication. -Continue taking medication as prescribed -Recommend keeping a BP diary -Follow up in 4 weeks      Dealer, MD [facility]

## 2021-04-30 NOTE — Assessment & Plan Note (Signed)
Patient BP is elevated today at 148/93. He reports home reading to be in the 120s/70s. He has history of anxiety and reports feeling anxious when he is outside. Patient was recently started on Amlodipine. Denies BLE, headache or dizziness. Reports adherences with his medication. -Continue taking medication as prescribed -Recommend keeping a BP diary -Follow up in 4 weeks

## 2021-04-30 NOTE — Patient Instructions (Signed)
It was wonderful to meet you today. Thank you for allowing me to be a part of your care. Below is a short summary of what we discussed at your visit today:  Continue medications as prescribed and bring record of your blood pressure readings for next visit   MRI showed L4-L5 disc herniation with nerve impingement. Being that you back pain improved since the switch of Job we will hold off on physical therapy and if the pain gets worse, please come back and start you on treatment  Follow up in 4 weeks for blood pressure check     If you have any questions or concerns, please do not hesitate to contact us via phone or MyChart message.   Jerre Simon, MD [facility]

## 2021-05-16 ENCOUNTER — Other Ambulatory Visit: Payer: Self-pay | Admitting: Family Medicine

## 2021-05-16 DIAGNOSIS — K625 Hemorrhage of anus and rectum: Secondary | ICD-10-CM

## 2022-01-04 ENCOUNTER — Other Ambulatory Visit: Payer: Self-pay | Admitting: Family Medicine

## 2022-01-26 ENCOUNTER — Other Ambulatory Visit: Payer: Self-pay | Admitting: Student

## 2022-02-01 ENCOUNTER — Encounter: Payer: Self-pay | Admitting: Student

## 2022-02-01 ENCOUNTER — Ambulatory Visit (INDEPENDENT_AMBULATORY_CARE_PROVIDER_SITE_OTHER): Payer: BC Managed Care – PPO | Admitting: Student

## 2022-02-01 VITALS — BP 126/68 | HR 62 | Ht 65.0 in | Wt 309.6 lb

## 2022-02-01 DIAGNOSIS — F411 Generalized anxiety disorder: Secondary | ICD-10-CM

## 2022-02-01 DIAGNOSIS — I10 Essential (primary) hypertension: Secondary | ICD-10-CM | POA: Diagnosis not present

## 2022-02-01 DIAGNOSIS — M5416 Radiculopathy, lumbar region: Secondary | ICD-10-CM

## 2022-02-01 MED ORDER — DULOXETINE HCL 60 MG PO CPEP: 60.0000 mg | ORAL_CAPSULE | Freq: Every day | ORAL | 3 refills | Status: DC

## 2022-02-01 NOTE — Assessment & Plan Note (Signed)
BP initially elevated on arrival to 150/99.  Repeat BP within goal at 126/68. Continue amlodipine 5 mg daily Follow-up blood pressure at next visit

## 2022-02-01 NOTE — Assessment & Plan Note (Signed)
Given MRI results with disc herniation and impingement, will refer to neurosurgery at this time. He is still able to complete his job, but it does sound like he is having significant pain and paresthesias. No red flag symptoms today. May continue with topical treatments, Advil and Tylenol as needed.

## 2022-02-01 NOTE — Assessment & Plan Note (Addendum)
PHQ-9 score of 22 discussed and reviewed with patient, #9 positive with a score of 2. He has no active SI, but does have passive SI. Given his increase in depressive symptoms, patient is agreeable to increasing Cymbalta to 60 mg daily. Close follow-up indicated, would like to see him back in 2 to 4 weeks to see how he is doing with the increase in Cymbalta and his mood. Provided with virtual therapy resources.

## 2022-02-01 NOTE — Progress Notes (Signed)
    SUBJECTIVE:   CHIEF COMPLAINT / HPI:   Low back pain/lumbar radiculopathy MRI results in 03/2021 showed L4-5 right foraminal herniation impinging on right L4 nerve root with L5-S1 disc degeneration causing by foraminal impingement. Mr. Iiams says that he has been having severe pain and numbness going down his right leg to the ankle. 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  He says that some days he feels like he can barely walk. He has been taking Aleve without much help. No saddle anesthesia, loss of bowel or bladder incontinence.  No fevers.  Hypertension Takes amlodipine 5 mg every night.  He did take it last night.  Does not check blood pressure at home.  No headaches, chest pain, shortness of breath.  Depressed mood Currently taking Cymbalta 30 mg daily.  He denies any really significant life stressors other than balancing work with going to school for computer programming. He denies active thoughts of harming himself, but rather fleeting thoughts.  He denies any active plan to hurt himself today or in the past 2 weeks. He says he has been feeling little bit more depressed lately, but is unsure why. He has a lot of social support including his 9 siblings, and his mom that lives across the street.  He says that these are reasons for him to stay alive. He says that he would be interested in trying therapy as his insurance would pay for 20 sessions a year.  The difficulty is finding time for the sessions between work and school.    PERTINENT  PMH / PSH: Reviewed  OBJECTIVE:   BP 126/68   Pulse 62   Ht 5\' 5"  (1.651 m)   Wt (!) 309 lb 9.6 oz (140.4 kg)   SpO2 100%   BMI 51.52 kg/m   General: Alert and cooperative and appears to be in no acute distress, obese Cardio: Normal S1 and S2, no S3 or S4. Rhythm is regular. No murmurs or rubs.   Pulm: Clear to auscultation bilaterally, no crackles, wheezing, or diminished  breath sounds. Normal respiratory effort Abdomen: Bowel sounds normal. Abdomen soft and non-tender.  Extremities: No peripheral edema. Warm/ well perfused.  Strong radial pulses. MSK: Tender to palpation over right quadratus lumborum muscle.  Strength and sensation intact in bilateral lower extremities grossly. Neuro: Cranial nerves grossly intact    ASSESSMENT/PLAN:   Generalized anxiety disorder PHQ-9 discussed and reviewed with patient, #9 positive with a score of 2. He has no active SI, but does have passive SI. Given his increase in depressive symptoms, patient is agreeable to increasing Cymbalta to 60 mg daily. Close follow-up indicated, would like to see him back in 2 to 4 weeks to see how he is doing with the increase in Cymbalta and his mood. Provided with virtual therapy resources.  Primary hypertension BP initially elevated on arrival to 150/99.  Repeat BP within goal at 126/68. Continue amlodipine 5 mg daily Follow-up blood pressure at next visit  Lumbar radiculopathy Given MRI results with disc herniation and impingement, will refer to neurosurgery at this time. He is still able to complete his job, but it does sound like he is having significant pain and paresthesias. No red flag symptoms today. May continue with topical treatments, Advil and Tylenol as needed.      , DO [facility]

## 2022-02-01 NOTE — Patient Instructions (Signed)
It was a pleasure seeing you today.  Schedule an appointment at the front desk for follow-up in 2 weeks so we can see how you are doing.  I placed a referral to neurosurgery for evaluation of your MRI results/further management. They will call you to schedule an appointment.  We increased your Cymbalta to 60 mg daily.   "Better Help" is an online therapy resource you can try.   Be well,  Dr. Darral Dash [facility] [phone]    .

## 2022-04-19 ENCOUNTER — Other Ambulatory Visit: Payer: Self-pay | Admitting: Family Medicine

## 2022-06-30 ENCOUNTER — Ambulatory Visit: Payer: BC Managed Care – PPO | Admitting: Student

## 2022-06-30 VITALS — BP 128/84 | HR 77 | Wt 309.2 lb

## 2022-06-30 DIAGNOSIS — M5416 Radiculopathy, lumbar region: Secondary | ICD-10-CM | POA: Diagnosis not present

## 2022-06-30 MED ORDER — ACETAMINOPHEN ER 650 MG PO TBCR: 650.0000 mg | EXTENDED_RELEASE_TABLET | Freq: Three times a day (TID) | ORAL | 0 refills | Status: AC | PRN

## 2022-06-30 NOTE — Progress Notes (Signed)
    SUBJECTIVE:   CHIEF COMPLAINT / HPI:   Patient is a 37 year old male presenting with chronic back pain. MRI in 2022 showed L4-L5 nerve impingement. He reports back pain has been going on for 5-6 years He has tried losing weight and massage chair which provide short relieve. Uses Naproxen frequently but did note occasional dark stool and blood in stool which he was told by GI could have  Describes pain as off and on, comes on when trying to lose weight or going to gym.  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 Hasn't had time because of work and school No urinary or bowel incontinence.  Denies any saddle paresthesia.   Depressed mood Patient's PHQ-9 today was elevated.  He does have chronic history of depression on Cymbalta 60 mg nightly.  Send medication previously helped but has not been as effective recently.  Described major contributors for his depression including work and school stress.  His back pain also contributes to his stress.  PERTINENT  PMH / PSH: Reviewed  OBJECTIVE:   BP 128/84   Pulse 77   Wt (!) 309 lb 3.2 oz (140.3 kg)   SpO2 97%   BMI 51.45 kg/m    Flowsheet Row Office Visit from 06/30/2022 in [place]  PHQ-9 Total Score 25        Physical Exam General: Alert, well appearing, NAD   Back No gross deformity, scoliosis. Mild TTP over the lumber midback.  No midline or bony TTP. FROM. Strength LLE 5/5 and L/5 on RLE   Negative SLRs. Sensation intact to light touch bilaterally.   ASSESSMENT/PLAN:   Back Pain Suspect patient's back pain is most likely multifactorial given the history and exam findings.  Obesity with BMI over 51 could be a compounding factor to his symptoms.  In addition to his degenerative lumbar disease seen on MRI and Nerve root  impingement, believe he likely have a musculoskeletal component worsening pain which work  associated with bending and lifting at work. No red flag symptoms such as saddle paresthesia or urinary/stool incontinence. -Discussed benefit of weight loss and encouraged weight loss -Encourage patient to use more Tylenol over NSAIDs given history of GI bleed -Placed referral for physical therapy (preferred weekend schedule) -Continue Cymbalta   Depression Patient with history of depression on Cymbalta 60 mg daily.  PHQ-9 elevated today at 25.  No active SI/HI.  Suspect patient's depressed mood is mostly attributed to his quality of life which include work and school stress, obesity, dealing with chronic back pain.  He believes most of his time is consumed by work and school. -Continue Cymbalta 60 mg. -Could consider switching to order antidepressant. -Discusses the importance of counseling and encouraging patient to find time for counseling.  Explained he could try to check with his insurance to find weekend counseling if possible.   Alen Bleacher, MD [place]

## 2022-06-30 NOTE — Patient Instructions (Signed)
It was wonderful to meet you today. Thank you for allowing me to be a part of your care. Below is a short summary of what we discussed at your visit today:  Have sent in referral to your physical therapy to see if we can find you 1 for the weekends.  Encourage you to do Tylenol which have sent in to your pharmacy.  You can do it 30 minutes before the size to improve your level.  You can seek counseling I will continue you on Cymbalta 60 mg nightly.  Also believe if you start exercising that could also improve your overall mood.  I will like to see you in 3-4 weeks for progress.  Please bring all of your medications to every appointment!  If you have any questions or concerns, please do not hesitate to contact us via phone or MyChart message.   Alen Bleacher, MD [facility]

## 2022-09-07 ENCOUNTER — Ambulatory Visit: Payer: BC Managed Care – PPO | Admitting: Student

## 2022-09-08 ENCOUNTER — Ambulatory Visit: Payer: BC Managed Care – PPO

## 2022-09-25 ENCOUNTER — Emergency Department (HOSPITAL_BASED_OUTPATIENT_CLINIC_OR_DEPARTMENT_OTHER)
Admission: EM | Admit: 2022-09-25 | Discharge: 2022-09-25 | Disposition: A | Discharge status: Home / Self Care | Payer: BC Managed Care – PPO | Attending: Emergency Medicine | Admitting: Emergency Medicine

## 2022-09-25 ENCOUNTER — Other Ambulatory Visit: Payer: Self-pay

## 2022-09-25 ENCOUNTER — Encounter (HOSPITAL_BASED_OUTPATIENT_CLINIC_OR_DEPARTMENT_OTHER): Payer: Self-pay | Admitting: Emergency Medicine

## 2022-09-25 ENCOUNTER — Emergency Department (HOSPITAL_BASED_OUTPATIENT_CLINIC_OR_DEPARTMENT_OTHER): Payer: BC Managed Care – PPO

## 2022-09-25 ENCOUNTER — Emergency Department (HOSPITAL_BASED_OUTPATIENT_CLINIC_OR_DEPARTMENT_OTHER): Payer: BC Managed Care – PPO | Admitting: Radiology

## 2022-09-25 DIAGNOSIS — M79605 Pain in left leg: Secondary | ICD-10-CM | POA: Diagnosis not present

## 2022-09-25 DIAGNOSIS — R2240 Localized swelling, mass and lump, unspecified lower limb: Secondary | ICD-10-CM | POA: Diagnosis not present

## 2022-09-25 DIAGNOSIS — M7989 Other specified soft tissue disorders: Secondary | ICD-10-CM | POA: Insufficient documentation

## 2022-09-25 HISTORY — DX: Essential (primary) hypertension: I10

## 2022-09-25 LAB — COMPREHENSIVE METABOLIC PANEL
ALT: 43 U/L (ref 0–44)
AST: 28 U/L (ref 15–41)
Albumin: 4.1 g/dL (ref 3.5–5.0)
Alkaline Phosphatase: 43 U/L (ref 38–126)
Anion gap: 8 (ref 5–15)
BUN: 16 mg/dL (ref 6–20)
CO2: 28 mmol/L (ref 22–32)
Calcium: 9.3 mg/dL (ref 8.9–10.3)
Chloride: 102 mmol/L (ref 98–111)
Creatinine, Ser: 0.96 mg/dL (ref 0.61–1.24)
GFR, Estimated: >60 mL/min (ref >60)
Glucose, Bld: 107 mg/dL — ABNORMAL HIGH (ref 70–99)
Potassium: 4.2 mmol/L (ref 3.5–5.1)
Sodium: 138 mmol/L (ref 135–145)
Total Bilirubin: 0.3 mg/dL (ref 0.3–1.2)
Total Protein: 7.4 g/dL (ref 6.5–8.1)

## 2022-09-25 LAB — CBC WITH DIFFERENTIAL/PLATELET
Abs Immature Granulocytes: 0.01 10*3/uL (ref 0.00–0.07)
Basophils Absolute: 0 10*3/uL (ref 0.0–0.1)
Basophils Relative: 0 %
Eosinophils Absolute: 0.1 10*3/uL (ref 0.0–0.5)
Eosinophils Relative: 2 %
HCT: 44.6 % (ref 39.0–52.0)
Hemoglobin: 14.3 g/dL (ref 13.0–17.0)
Immature Granulocytes: 0 %
Lymphocytes Relative: 44 %
Lymphs Abs: 2.3 10*3/uL (ref 0.7–4.0)
MCH: 26.4 pg (ref 26.0–34.0)
MCHC: 32.1 g/dL (ref 30.0–36.0)
MCV: 82.3 fL (ref 80.0–100.0)
Monocytes Absolute: 0.5 10*3/uL (ref 0.1–1.0)
Monocytes Relative: 11 %
Neutro Abs: 2.2 10*3/uL (ref 1.7–7.7)
Neutrophils Relative %: 43 %
Platelets: 345 10*3/uL (ref 150–400)
RBC: 5.42 MIL/uL (ref 4.22–5.81)
RDW: 14.6 % (ref 11.5–15.5)
WBC: 5.1 10*3/uL (ref 4.0–10.5)
nRBC: 0 % (ref 0.0–0.2)

## 2022-09-25 LAB — PROTIME-INR
INR: 1 (ref 0.8–1.2)
Prothrombin Time: 12.9 seconds (ref 11.4–15.2)

## 2022-09-25 NOTE — ED Triage Notes (Addendum)
Pt arrives pov, steady gait with c/o  LLE pain, swelling at calf x 2 weeks. Pt denies pain, endorses recent long car travel, denies injury, denies CP

## 2022-09-25 NOTE — ED Provider Notes (Signed)
[place] EMERGENCY DEPARTMENT AT [facility] Provider Note   CSN: [id number] Arrival date & time: 09/25/22  4098     History  Chief Complaint  Patient presents with   Leg Swelling    Darrell Monroe is a 37 y.o. male who presents emergency department with concerns for left lower extremity pain/swelling.  Patient notes that he did not notice the pain or swelling to his left lower extremity.  He notes his mother told him about the swelling to his leg on yesterday.  However his brother mentioned the swelling to him in March.  Patient notes that the swelling was present before his trip to [place].  Patient denies recent surgery, hormonal replacement therapy, past medical history of PE/DVT, anticoagulants at this time.  No meds tried at home.  Denies concerns for chest pain, shortness of breath, redness.  The history is provided by the patient. No language interpreter was used.       Home Medications Prior to Admission medications   Medication Sig Start Date End Date Taking? Authorizing Provider  acetaminophen (TYLENOL 8 HOUR) 650 MG CR tablet Take 1 tablet (650 mg total) by mouth every 8 (eight) hours as needed for pain. 06/30/22   Jerre Simon, MD  amLODipine (NORVASC) 5 MG tablet TAKE 1 TABLET BY MOUTH EVERYDAY AT BEDTIME 04/20/22   Jerre Simon, MD  DULoxetine (CYMBALTA) 60 MG capsule Take 1 capsule (60 mg total) by mouth daily. 02/01/22   Dameron, Nolberto Hanlon, DO  famotidine (PEPCID) 40 MG tablet TAKE 1 TABLET BY MOUTH EVERY DAY 05/17/21   Cresenzo, Cyndi Lennert, MD  methocarbamol (ROBAXIN) 500 MG tablet Take 1 tablet (500 mg total) by mouth 2 (two) times daily. 03/16/21   Pollyann Savoy, MD  pantoprazole (PROTONIX) 40 MG tablet TAKE 1 TABLET BY MOUTH ONCE A DAY 30 DAY(S) Patient not taking: No sig reported 06/18/20 06/18/21  Charlott Rakes, MD  Vitamin D, Ergocalciferol, (DRISDOL) 1.25 MG (50000 UNIT) CAPS capsule Take 50,000 Units by mouth every Thursday. 07/21/20   [provider]  Vitamin D, Ergocalciferol, (DRISDOL) 1.25 MG (50000 UNIT) CAPS capsule Take 1 capsule (50,000 Units total) by mouth once a week. 01/29/21   Celedonio Savage, MD      Allergies    Other    Review of Systems   Review of Systems  All other systems reviewed and are negative.   Physical Exam Updated Vital Signs BP (!) 157/109 (BP Location: Right Arm)   Pulse 85   Temp 97.9 F (36.6 C) (Oral)   Resp 18   Wt (!) 143.1 kg   SpO2 99%   BMI 52.50 kg/m  Physical Exam Vitals and nursing note reviewed.  Constitutional:      General: He is not in acute distress.    Appearance: He is not diaphoretic.  HENT:     Head: Normocephalic and atraumatic.     Mouth/Throat:     Pharynx: No oropharyngeal exudate.  Eyes:     General: No scleral icterus.    Conjunctiva/sclera: Conjunctivae normal.  Cardiovascular:     Rate and Rhythm: Normal rate and regular rhythm.     Pulses: Normal pulses.     Heart sounds: Normal heart sounds.  Pulmonary:     Effort: Pulmonary effort is normal. No respiratory distress.     Breath sounds: Normal breath sounds. No wheezing.  Abdominal:     General: Bowel sounds are normal.     Palpations: Abdomen is soft.  There is no mass.     Tenderness: There is no abdominal tenderness. There is no guarding or rebound.  Musculoskeletal:        General: Normal range of motion.     Cervical back: Normal range of motion and neck supple.     Comments: Unilateral left lower leg swelling with mild TTP noted to left upper calf. Able to flex and extend against resistance.   Skin:    General: Skin is warm and dry.  Neurological:     Mental Status: He is alert.  Psychiatric:        Behavior: Behavior normal.     ED Results / Procedures / Treatments   Labs (all labs ordered are listed, but only abnormal results are displayed) Labs Reviewed  COMPREHENSIVE METABOLIC PANEL - Abnormal; Notable for the following components:      Result Value   Glucose, Bld 107  (*)    All other components within normal limits  CBC WITH DIFFERENTIAL/PLATELET  PROTIME-INR    EKG None  Radiology DG Tibia/Fibula Left  Result Date: 09/25/2022 CLINICAL DATA:  Left lower extremity pain and swelling. EXAM: LEFT TIBIA AND FIBULA - 2 VIEW COMPARISON:  None Available. FINDINGS: There is no evidence of fracture or other focal bone lesions. Soft tissues are unremarkable. IMPRESSION: Negative. Electronically Signed   By: Kennith Center M.D.   On: 09/25/2022 12:40   US Venous Img Lower Unilateral Left  Result Date: 09/25/2022 CLINICAL DATA:  Left calf swelling with discomfort EXAM: LEFT LOWER EXTREMITY VENOUS DOPPLER ULTRASOUND TECHNIQUE: Gray-scale sonography with compression, as well as color and duplex ultrasound, were performed to evaluate the deep venous system(s) from the level of the common femoral vein through the popliteal and proximal calf veins. COMPARISON:  None Available. FINDINGS: VENOUS Normal compressibility of the common femoral, superficial femoral, and popliteal veins, as well as the visualized calf veins. Limited views of the posterior tibial vein, the peroneal vein was not visualized, attributable to body habitus. Visualized portions of profunda femoral vein and great saphenous vein unremarkable. No filling defects to suggest DVT on grayscale or color Doppler imaging. Doppler waveforms show normal direction of venous flow, normal respiratory plasticity and response to augmentation. Limited views of the contralateral common femoral vein are unremarkable. OTHER None. Limitations: none IMPRESSION: 1. No evidence of left lower extremity DVT. Electronically Signed   By: Gaylyn Rong M.D.   On: 09/25/2022 11:08    Procedures Procedures    Medications Ordered in ED Medications - No data to display  ED Course/ Medical Decision Making/ A&P Clinical Course as of 09/25/22 1749  Sun Sep 25, 2022  1311 Re-evaluated and noted improvement of symptoms with treatment  regimen. Discussed discharge treatment plan. Pt agreeable at this time. Pt appears safe for discharge. [SB]    Clinical Course User Index [SB] Allesha Aronoff A, PA-C                             Medical Decision Making Amount and/or Complexity of Data Reviewed Labs: ordered. Radiology: ordered.   Pt presents with LLE swelling. Vital signs, patient afebrile. On exam, pt with Unilateral left lower leg swelling with mild TTP noted to left upper calf. Able to flex and extend against resistance. Differential diagnosis includes DVT, cellulitis, abscess, fracture, dislocation.    Imaging: I ordered imaging studies including LLE DVT, left tib/fib xray I independently visualized and interpreted imaging which showed:  1. No evidence of left lower extremity DVT.  Xray with no acute findings I agree with the radiologist interpretation   Disposition: Presentation suspicious for leg swelling.  Doubt concerns at this time for fracture, dislocation, cellulitis, abscess, DVT.  No concerns at this time for neck Fash, patient notes that this has been going on for several weeks per his family members. After consideration of the diagnostic results and the patients response to treatment, I feel that the patient would benefit from Discharge home. Supportive care measures and strict return precautions discussed with patient at bedside. Pt acknowledges and verbalizes understanding. Pt appears safe for discharge. Follow up as indicated in discharge paperwork.    This chart was dictated using voice recognition software, Dragon. Despite the best efforts of this provider to proofread and correct errors, errors may still occur which can change documentation meaning.  Final Clinical Impression(s) / ED Diagnoses Final diagnoses:  Left leg swelling    Rx / DC Orders ED Discharge Orders     None         Vasiliki Smaldone A, PA-C 09/25/22 1749    Alvira Monday, MD 09/28/22 2322

## 2022-09-25 NOTE — Discharge Instructions (Addendum)
It was a pleasure taking care of you today!  Your lab and imaging studies didn't show any emergent findings at this time. There was no blood clot found to your left leg today. Follow up with your primary care provider at [facility] regarding todays ED visit. Return to the ED if you are experiencing increasing/worsening pain, redness, swelling, chest pain, trouble breathing, or worsening symptoms.

## 2023-01-12 ENCOUNTER — Other Ambulatory Visit (HOSPITAL_COMMUNITY): Payer: Self-pay

## 2023-02-02 ENCOUNTER — Other Ambulatory Visit: Payer: Self-pay | Admitting: Student

## 2023-03-01 ENCOUNTER — Other Ambulatory Visit: Payer: Self-pay | Admitting: Student

## 2023-04-12 ENCOUNTER — Encounter: Payer: Self-pay | Admitting: Family Medicine

## 2023-04-12 ENCOUNTER — Ambulatory Visit: Payer: BC Managed Care – PPO | Admitting: Family Medicine

## 2023-04-12 VITALS — BP 133/86 | HR 63 | Ht 65.0 in | Wt 309.2 lb

## 2023-04-12 DIAGNOSIS — R7303 Prediabetes: Secondary | ICD-10-CM | POA: Diagnosis not present

## 2023-04-12 DIAGNOSIS — Z6841 Body Mass Index (BMI) 40.0 and over, adult: Secondary | ICD-10-CM | POA: Diagnosis not present

## 2023-04-12 LAB — POCT GLYCOSYLATED HEMOGLOBIN (HGB A1C): Hemoglobin A1C: 6 % — AB (ref 4.0–5.6)

## 2023-04-12 MED ORDER — WEGOVY 0.25 MG/0.5ML ~~LOC~~ SOAJ
0.2500 mg | SUBCUTANEOUS | 0 refills | Status: AC
Start: 2023-04-12 — End: ?

## 2023-04-12 NOTE — Progress Notes (Signed)
    SUBJECTIVE:   CHIEF COMPLAINT / HPI:   Here to discuss weight loss Hx prediabetes last A1c 5.9 in 2022, not on meds A1c is 6.0 today  Has been trying to lose weight for about 3 years Regularly exercises - is on his feet at work all day, walks about 17000-25000 steps per day, lifts heavy things regularly at work. Has a gym membership Also has an exercise bike at home which he regularly uses Diet - has tried to cut down on sugary drinks and foods, has tried drinking more smoothies. Does eat a fair amount of processed foods/fast food  He is interested in medical therapy at this point since he has been trying to lose weight for several years.  However, he is also open to meeting with our healthy weight specialists  Denies any tobacco use, drinking, drug use  PERTINENT  PMH / PSH: prediabetes  OBJECTIVE:   BP 133/86   Pulse 63   Ht 5\' 5"  (1.651 m)   Wt (!) 309 lb 4 oz (140.3 kg)   SpO2 99%   BMI 51.46 kg/m   General: NAD, pleasant, able to participate in exam Cardiac: RRR, no murmurs auscultated Respiratory: CTAB, normal WOB Abdomen: soft, non-tender, non-distended, normoactive bowel sounds Extremities: warm and well perfused, no edema or cyanosis Skin: warm and dry, no rashes noted Neuro: alert, no obvious focal deficits, speech normal Psych: Normal affect and mood  ASSESSMENT/PLAN:   Assessment & Plan Morbid obesity with BMI of 50.0-59.9, adult (HCC) In the setting of prediabetes, A1c 6.0 today.  Has tried multiple lifestyle interventions for weight loss without success over the past 3 years.  His BMI is 51.46..  I have placed a referral for him to be seen by her healthy weight wellness specialists; however, I do feel that he would benefit greatly from medical therapy for weight loss.  Prescribed Wegovy 0.25 mg weekly.  If this is not covered, may consider Qsymia or Contrave. F/u in 4wks if able to start [facility].   Vonna Drafts, MD [facility]

## 2023-04-12 NOTE — Patient Instructions (Addendum)
I have attempted to prescribe Scnetx for similar pharmacy.  If the cost is too high and it is not covered, please let me know and I can attempt to send in a different medication.  Please follow-up in 1 month if you are able to start [facility].  Have also placed referral to healthy weight and wellness clinic where you can have more detailed discussions about nutrition and lifestyle interventions to help with weight loss if the medication route does not work out.

## 2023-04-12 NOTE — Assessment & Plan Note (Signed)
In the setting of prediabetes, A1c 6.0 today.  Has tried multiple lifestyle interventions for weight loss without success over the past 3 years.  His BMI is 51.46..  I have placed a referral for him to be seen by her healthy weight wellness specialists; however, I do feel that he would benefit greatly from medical therapy for weight loss.  Prescribed Wegovy 0.25 mg weekly.  If this is not covered, may consider Qsymia or Contrave. F/u in 4wks if able to start [facility].

## 2023-04-22 ENCOUNTER — Other Ambulatory Visit: Payer: Self-pay | Admitting: Student

## 2023-05-28 ENCOUNTER — Other Ambulatory Visit: Payer: Self-pay | Admitting: Student

## 2023-11-28 ENCOUNTER — Ambulatory Visit: Admitting: Student

## 2023-11-29 ENCOUNTER — Ambulatory Visit: Admitting: Student

## 2024-06-04 ENCOUNTER — Other Ambulatory Visit: Payer: Self-pay

## 2024-06-06 MED ORDER — AMLODIPINE BESYLATE 5 MG PO TABS: 5.0000 mg | ORAL_TABLET | Freq: Every day | ORAL | 11 refills | Status: DC

## 2024-06-12 ENCOUNTER — Ambulatory Visit: Payer: Self-pay

## 2024-06-12 VITALS — BP 168/104 | HR 76 | Ht 65.0 in | Wt 319.6 lb

## 2024-06-12 DIAGNOSIS — Z23 Encounter for immunization: Secondary | ICD-10-CM

## 2024-06-12 DIAGNOSIS — F324 Major depressive disorder, single episode, in partial remission: Secondary | ICD-10-CM

## 2024-06-12 DIAGNOSIS — R200 Anesthesia of skin: Secondary | ICD-10-CM

## 2024-06-12 DIAGNOSIS — M542 Cervicalgia: Secondary | ICD-10-CM | POA: Diagnosis not present

## 2024-06-12 DIAGNOSIS — M5416 Radiculopathy, lumbar region: Secondary | ICD-10-CM

## 2024-06-12 DIAGNOSIS — R030 Elevated blood-pressure reading, without diagnosis of hypertension: Secondary | ICD-10-CM

## 2024-06-12 MED ORDER — GABAPENTIN 100 MG PO CAPS: 100.0000 mg | ORAL_CAPSULE | Freq: Every day | ORAL | 3 refills | Status: DC

## 2024-06-12 MED ORDER — AMLODIPINE BESYLATE 5 MG PO TABS: 10.0000 mg | ORAL_TABLET | Freq: Every day | ORAL | 11 refills | Status: AC

## 2024-06-12 NOTE — Patient Instructions (Addendum)
 It was great to meet you today!  For your back pain, given that you are having weakness in your legs, I have referred you for an evaluation with an orthopedic doctor. I've also ordered imaging of your neck and low back. Start with an xray of your neck, which can be done as a walk in visit at [facility]: Address: [address] [place], [place], [place] [address]. You will get a phone call to schedule MRI imaging of your neck and low back as well, and this will help the orthopedic doctor's evaluation.   Additionally, I've started you on gabapentin , which is a medication that will help with your pain radiating into your legs. Start with 100mg  at night for a week. If you do not feel too tired in the morning, you can take one in the morning as well. After another week, you can consider taking another dose at lunch time. Stop increasing once you are taking it three times per day or once you are starting to feel too much of a drowsy effect from the medication.   Finally, I'm increasing your blood pressure medication, amlodipine , to 10mg  per day given how elevated your pressure was in the office today. We'll continue to follow this moving forward.   Please continue to take your duloxetine , as this is helpful for both mood and for your chronic pain.   Please follow up in 2 weeks with Dr Toma 1/28 @ 10:10 to discuss your mood.   Please schedule your follow up visit at the front desk before you leave today.   Thank you for choosing [facility].   Please call [phone] with any questions about today's appointment.  Leafy Scriver, DO Family Medicine     If you are feeling suicidal or depression symptoms worsen please immediately go to:   If you are thinking about harming yourself or having thoughts of suicide, or if you know someone who is, seek help right away. If you are in crisis, make sure you are not left alone.  If someone else is in crisis, make sure he/she/they is not  left alone  Call 988 OR 1-800-273-TALK  24 Hour Availability for Walk-IN services  [facility]  [address] [place], [place] [phone] Crisis [phone]    Other crisis resources:  [facility] [employer] (Domestic Violence, Rape & Victim Assistance [phone]  [facility] [employer] [facility]    (ONLY from 8am-4pm)    [phone]  [facility] (24/7)   [phone]  [place]  National Suicide Hotline   [phone] MERRILYN)

## 2024-06-12 NOTE — Progress Notes (Signed)
 "   SUBJECTIVE:   CHIEF COMPLAINT / HPI:   Patient has a 3-4 year history of chronic low back pain w/ b/l radiculopathy. He reports sitting makes it worse within minutes. Has tried PT in the past which was helpful but transportation is a challenge. Has also tried lidocaine patches without relief, hasn't been taking anything else consistently for the pain. This pain is making it hard for him to lift boxes at [employer] where he works. He also notes he feels like his legs are getting weaker, walking feels less symmetrical than he's used to, with L leg needing to be his lead leg. Never had any operations on his back previously. Denies saddle anesthesia, bowel or bladder incontinence.   Pt reporting 3-4 months of b/l hand and forearm numbness, cramping, grip strength issues w/ concomitant neck pain. His neck pain is primarily stiffness in the AM, denies any particular injury or moment we he first noticed it. It usually fades when he massages his neck or puts a heating pad on it in the AM. Denies pain radiating down into his arms. He notes numbness in b/l hands and forearms, especially when waking up from sleep. He frequently flicks his hands to wake them up at work. He has also noticed intermittent cramping bilaterally, particularly when gripping smaller boxes or trying to use a video game controller, noticing regularly dropping objects now. L side has greater numbness than R. He hasn't tried any medications or other interventions for the numbness, has been soaking his hands in warm water to help with the cramping.   Patient reports feeling like his duloxetine  60mg  has been decreasing in its effectiveness lately, has been adherent and not missing doses. Mood significantly affected by his chronic pain and challenges with function detailed above.   He reports his ability to attend regular follow up is somewhat limited by lack of transportation. He has a car but is working on getting his license so he's been  ubering to all appointments.   PERTINENT  PMH / PSH: Chronic low back pain, HTN, MDD  OBJECTIVE:   BP (!) 168/104   Pulse 76   Ht 5' 5 (1.651 m)   Wt (!) 145 kg   SpO2 98%   BMI 53.18 kg/m   General: Awake, alert, NAD. Communicates clearly. Cardio: RRR. 2+ radial pulses b/l w/ good capillary refill.  Resp: CTA bilaterally. Normal work of breathing on room air.  MSK: Full ROM w/ no TTP or obvious deformity in b/l UE and LE. B/l paraspinal TTP and positive Kemp's test.  Neuro:  -Bilateral UE strength 5/5. Sensation to light touch impaired over b/l 3rd-5th digits and over b/l forearms with unclear distribution. Negative phalen's and tinel's over carpal and cubital tunnels.   -Hip flexion (L2), knee extension (L4), hallux extension (L5) and plantar flexion (S1) 5/5 on the L, R plantarflexion 3/5 w/ strength otherwise 5/5.  Psych: Flat mood and affect. Denies active SI/HI today.     ASSESSMENT/PLAN:   Assessment & Plan Lumbar radiculopathy Chronic low back pain w/ b/l radiculopathy, now w/ associated lower extremity strength deficit (R plantarflexion, likely S1 related).  Gabapentin  100mg  at bedtime, titrate up to TID as tolerated over 3 weeks.  MRI w/o contrast L spine, last study in 2022 Expedited referral to ortho Cervicalgia Bilateral hand numbness 3-4 months of b/l hand and forearm numbness, cramping, grip strength issues w/ concomitant neck pain.  Differential primarily includes carpal tunnel, cubital tunnel, or cervical radiculopathy. Hx most concerning  for carpal tunnel but exam more consistent w/ ulnar distribution.  C spine XR followed by MRI w/o contrast  Refer to ortho Depression, major, single episode, in partial remission PHQ9 elevated to 19 today, w/ 1 on Q9.  Reports no plan to hurt himself or others, no access to weapons.  Feels safe to go home to self care today and will f/u in 2 weeks for further management of mood.  Elevated blood pressure reading BP  significantly elevated today Increase amlodipine  from 5mg  to 10mg  daily F/u appointment for more dedicated HTN care would be appropriate Encounter for immunization Flu shot administered today   F/u in 2 weeks w/ Dr Toma.   Leafy Scriver, DO [facility] "

## 2024-06-13 NOTE — Assessment & Plan Note (Addendum)
 Chronic low back pain w/ b/l radiculopathy, now w/ associated lower extremity strength deficit (R plantarflexion, likely S1 related).  Gabapentin  100mg  at bedtime, titrate up to TID as tolerated over 3 weeks.  MRI w/o contrast L spine, last study in 2022 Expedited referral to ortho

## 2024-06-13 NOTE — Assessment & Plan Note (Signed)
 BP significantly elevated today Increase amlodipine  from 5mg  to 10mg  daily F/u appointment for more dedicated HTN care would be appropriate

## 2024-06-13 NOTE — Assessment & Plan Note (Signed)
 PHQ9 elevated to 19 today, w/ 1 on Q9.  Reports no plan to hurt himself or others, no access to weapons.  Feels safe to go home to self care today and will f/u in 2 weeks for further management of mood.

## 2024-06-19 ENCOUNTER — Ambulatory Visit: Payer: Self-pay | Admitting: Student

## 2024-06-19 ENCOUNTER — Encounter: Payer: Self-pay | Admitting: Student

## 2024-06-19 DIAGNOSIS — M5416 Radiculopathy, lumbar region: Secondary | ICD-10-CM | POA: Diagnosis not present

## 2024-06-19 DIAGNOSIS — R200 Anesthesia of skin: Secondary | ICD-10-CM | POA: Diagnosis not present

## 2024-06-19 MED ORDER — GABAPENTIN 300 MG PO CAPS: 300.0000 mg | ORAL_CAPSULE | Freq: Three times a day (TID) | ORAL | 1 refills | Status: AC

## 2024-06-19 NOTE — Assessment & Plan Note (Signed)
 No concerning neurological findings on exam  Increase gabapentin  to 300 mg TID  Encouraged patient to get cervical x-rays as ordered last time  He will need follow up after MRI Discussed safe titration of gabapentin . He may need to increase to 600 mg TID with BMI Work note provided for lifting <20 lbs

## 2024-06-19 NOTE — Patient Instructions (Signed)
 Increased your gabapentin  to 300 mg three times daily. You can go up to 600 mg three times daily if needed.  This can make you sleepy so try the higher dosage during the day on your day off.   You can get your x-ray across [address] at the following address:  [facility] [address]  [phone] You do not need an appointment.

## 2024-06-19 NOTE — Progress Notes (Signed)
" ° ° °  SUBJECTIVE:   CHIEF COMPLAINT / HPI:   Darrell Monroe is a 39 year old male with lumbar radiculopathy and cerviculopathy who presents for follow-up.  He was recently started on gabapentin  100 mg for lumbar radiculopathy and cerviculopathy. He has been taking it once nightly without improvement.  He has not yet completed his cervical spine x-ray and is awaiting lumbar and cervical spine MRI. He works in a retail back room lifting heavy objects and now has difficulty lifting more than twenty pounds, and he is trying to get his employer to limit heavy lifting.  He is taking Cymbalta  and feels it is helpful.  PERTINENT  PMH / PSH: reviewed and updated.  OBJECTIVE:   BP (!) 142/89   Pulse 88   Ht 5' 5 (1.651 m)   Wt (!) 319 lb 3.2 oz (144.8 kg)   SpO2 100%   BMI 53.12 kg/m   Well-appearing, no acute distress Cardio: Regular rate, regular rhythm, no murmurs on exam. Pulm: Clear, no wheezing, no crackles. No increased work of breathing Abdominal: bowel sounds present, soft, non-tender, non-distended Extremities: no peripheral edema, 5/5 strength in the upper and lower extremities    ASSESSMENT/PLAN:   Assessment & Plan Lumbar radiculopathy Bilateral hand numbness No concerning neurological findings on exam  Increase gabapentin  to 300 mg TID  Encouraged patient to get cervical x-rays as ordered last time  He will need follow up after MRI Discussed safe titration of gabapentin . He may need to increase to 600 mg TID with BMI Work note provided for lifting <20 lbs     Damien Pinal, DO [facility]  "

## 2024-06-26 ENCOUNTER — Ambulatory Visit: Payer: Self-pay | Admitting: Family Medicine

## 2024-06-26 NOTE — Progress Notes (Unsigned)
 "  SUBJECTIVE:   CHIEF COMPLAINT / HPI:  Darrell Monroe is a 39 y.o. male with a pertinent past medical history of *** presenting to the clinic for ***.  Lumbar radiculopathy, bilateral hand numbness 1/14 - Noted to have RLE plantarflexion strength deficit and bilateral hand numbness.  XR C spine followed by MR C spine WO and MR L spine WO ordered.  Referred to Ortho Surgery urgently.  Gabapenting 100 mg TID started. 1/21 - No new findings, imaging not yet obtained.  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  Increased gabapentin  to 300 mg TID. Today, patient reports symptoms have not changed or improved. Has still not obtained cervical XR and is still waiting for MRI auth.  MDD, GAD 1/14 - Answer of 1 to Q9 on PHQ9.  Endorsed passive SI, no plan. Today, patient denies SI/HI***. Reports ***. States he has no plan. Has no access to firearms. Supported by [employer] Taking duloxetine  60 mg daily.  Risk Factors:  Other mental health disorders: *** On/Off Prescribed medications: *** Prior Attempts: *** Family History: *** Access to Lethal Means: *** Social Isolation: *** Co-Morbid Health Conditions: *** Delusions/Hallucinations (command hallucinations): *** Hx of Substance abuse: *** Decreased/Increased Appetite: *** Decreased/Increased Sleep: ***  Protective Factors:  Family Support: *** Engaged in Outpatient Treatment: *** Cultural/Religious Connections: *** No access to lethal means: ***  Overall Risk: chronic v. Active suicidal mode   Assess Ideation:  In the past 2 days, week, months have you had thoughts of killing yourself: *** How often do thoughts occur: *** When do they have thoughts, are they interfering with life: *** In the past few weeks, have you wished you were dead: *** In the past few weeks have you felt that you or your family would be better off if you were dead: *** Are you have thoughts of killing yourself right now: ***  Assess Plan:  Do you  have a plan to kill yourself: *** If you were going to kill yourself, how would you do it: ***  Assess Behaviors:  Have you ever tried to hurt yourself and how did you do it: *** Nature/outcome of past attempts: *** Self-Injurious Behaviors: ***  Intent:  What are some reasons you would NOT kill your yourself: *** What are some reasons you WOULD kill yourself: *** Do you expect to carry out your plan: *** Do you believe your actions will be lethal: ***   PERTINENT PMH / PSH: Bilateral lumbar radiculopathy Bilateral hand numbness, suspected cervical radiculopathy MDD, GAD  *Remainder reviewed in problem list.   OBJECTIVE:   There were no vitals taken for this visit. Physical Exam General: Age-appropriate, resting comfortably in chair, NAD, alert and at baseline. HEENT:  Head: Normocephalic, atraumatic. No tenderness to percussion over sinuses. Eyes: PERRLA. No conjunctival erythema or scleral injections. Ears: TMs non-bulging and non-erythematous bilaterally. No erythema of external ear canal. No cerumen impaction. Nose: Non-erythematous turbinates. No rhinorrhea. Mouth/Oral: Clear, no tonsillar exudate. MMM. Neck: Supple. No LAD. Cardiovascular: Regular rate and rhythm. Normal S1/S2. No murmurs, rubs, or gallops appreciated. 2+ radial pulses. Pulmonary: Clear bilaterally to ascultation. No wheezes, crackles, or rhonchi. Normal WOB on room air. No accessory muscle use. Abdominal: No tenderness to deep or light palpation. No rebound or guarding. No HSM. Skin: Warm and dry. Extremities: No peripheral edema bilaterally. Capillary refill <2 seconds. Psych:  Cognition and judgment appear intact. Alert, communicative  and cooperative with normal attention span and concentration. No apparent  delusions, illusions, hallucinations.  No results found for this or any previous visit (from the past 48 hours).     06/12/2024   11:15 AM  Depression screen PHQ 2/9  Decreased Interest 1   Down, Depressed, Hopeless 1  PHQ - 2 Score 2  Altered sleeping 3  Tired, decreased energy 3  Change in appetite 3  Feeling bad or failure about yourself  2  Trouble concentrating 2  Moving slowly or fidgety/restless 3  Suicidal thoughts 1  PHQ-9 Score 19     ASSESSMENT/PLAN:   - Increase gabapentin  to 300 mg TID - Provided [facility] contact info to call - Reordered MR lumbar WO and MR C spine WO, changed location to GI due to insurance auth only being for GI  You have been referred to Orthopedic Surgery.  Please call the number below to schedule an appointment. [facility]  [address] [phone]  Safety Planning (Copy and Paste into AVS): Identifying Signs and Triggers:  Things that make thoughts worse  How to avoid situations:   Coping Strategies:  Things that help patient feel better:  Goals for implementation:   Distractions:  Identify people that can help distract from thoughts:  Identify social situations or places that help distract:   People to ask for help:  List:   Reasons to keep living:  List:   Therapy Resources:  Please go to:  [facility] [address]  [place], [place] [address] [phone] Urgent psychiatry (medication management) Monday-Thursday 8-11AM.   It is highly recommended that you show up at 7/730 because it is first come first serve. For urgent therapy (not medication) Walk in hours are 8-1pm Monday through Wednesday (please come at 7/730 to ensure you are seen)  Assessment & Plan Cervicalgia  Lumbar radiculopathy   No follow-ups on file.  Cozette Braggs Toma, MD [facility] "

## 2024-07-04 ENCOUNTER — Other Ambulatory Visit: Payer: Self-pay

## 2024-07-04 MED ORDER — DULOXETINE HCL 60 MG PO CPEP: 60.0000 mg | ORAL_CAPSULE | Freq: Every day | ORAL | 5 refills | Status: AC

## 2024-08-12 ENCOUNTER — Ambulatory Visit: Admitting: Orthopedic Surgery
